# Patient Record
Sex: Female | Born: 2000 | Race: White | Hispanic: No | Marital: Single | Smoking: Never smoker
Health system: Southern US, Community
[De-identification: ages and names within clinical notes are randomized; demographics above are authoritative.]

## PROBLEM LIST (undated history)

## (undated) DIAGNOSIS — D649 Anemia, unspecified: Secondary | ICD-10-CM

## (undated) DIAGNOSIS — F32A Depression, unspecified: Secondary | ICD-10-CM

## (undated) DIAGNOSIS — F419 Anxiety disorder, unspecified: Secondary | ICD-10-CM

## (undated) DIAGNOSIS — F329 Major depressive disorder, single episode, unspecified: Secondary | ICD-10-CM

---
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
## 2020-05-29 DIAGNOSIS — Z419 Encounter for procedure for purposes other than remedying health state, unspecified: Secondary | ICD-10-CM | POA: Diagnosis not present

## 2020-06-29 DIAGNOSIS — Z419 Encounter for procedure for purposes other than remedying health state, unspecified: Secondary | ICD-10-CM | POA: Diagnosis not present

## 2020-07-30 DIAGNOSIS — Z419 Encounter for procedure for purposes other than remedying health state, unspecified: Secondary | ICD-10-CM | POA: Diagnosis not present

## 2020-08-29 DIAGNOSIS — Z419 Encounter for procedure for purposes other than remedying health state, unspecified: Secondary | ICD-10-CM | POA: Diagnosis not present

## 2020-09-29 DIAGNOSIS — Z419 Encounter for procedure for purposes other than remedying health state, unspecified: Secondary | ICD-10-CM | POA: Diagnosis not present

## 2020-10-29 DIAGNOSIS — Z419 Encounter for procedure for purposes other than remedying health state, unspecified: Secondary | ICD-10-CM | POA: Diagnosis not present

## 2020-11-29 DIAGNOSIS — Z419 Encounter for procedure for purposes other than remedying health state, unspecified: Secondary | ICD-10-CM | POA: Diagnosis not present

## 2020-12-18 ENCOUNTER — Other Ambulatory Visit: Payer: Self-pay

## 2020-12-18 ENCOUNTER — Ambulatory Visit (INDEPENDENT_AMBULATORY_CARE_PROVIDER_SITE_OTHER): Payer: Medicaid Other

## 2020-12-18 ENCOUNTER — Ambulatory Visit (INDEPENDENT_AMBULATORY_CARE_PROVIDER_SITE_OTHER): Payer: Medicaid Other | Admitting: Orthopaedic Surgery

## 2020-12-18 ENCOUNTER — Encounter: Payer: Self-pay | Admitting: Orthopaedic Surgery

## 2020-12-18 DIAGNOSIS — M79671 Pain in right foot: Secondary | ICD-10-CM | POA: Diagnosis not present
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
## 2020-12-30 DIAGNOSIS — Z419 Encounter for procedure for purposes other than remedying health state, unspecified: Secondary | ICD-10-CM | POA: Diagnosis not present

## 2021-01-13 DIAGNOSIS — Z3049 Encounter for surveillance of other contraceptives: Secondary | ICD-10-CM | POA: Diagnosis not present

## 2021-01-13 DIAGNOSIS — Z3046 Encounter for surveillance of implantable subdermal contraceptive: Secondary | ICD-10-CM | POA: Diagnosis not present

## 2021-01-13 DIAGNOSIS — Z113 Encounter for screening for infections with a predominantly sexual mode of transmission: Secondary | ICD-10-CM | POA: Diagnosis not present

## 2021-01-13 DIAGNOSIS — Z30011 Encounter for initial prescription of contraceptive pills: Secondary | ICD-10-CM | POA: Diagnosis not present

## 2021-01-27 DIAGNOSIS — Z419 Encounter for procedure for purposes other than remedying health state, unspecified: Secondary | ICD-10-CM | POA: Diagnosis not present

## 2021-02-27 DIAGNOSIS — Z419 Encounter for procedure for purposes other than remedying health state, unspecified: Secondary | ICD-10-CM | POA: Diagnosis not present

## 2021-03-29 DIAGNOSIS — Z419 Encounter for procedure for purposes other than remedying health state, unspecified: Secondary | ICD-10-CM | POA: Diagnosis not present

## 2021-04-23 DIAGNOSIS — Z6822 Body mass index (BMI) 22.0-22.9, adult: Secondary | ICD-10-CM | POA: Diagnosis not present

## 2021-04-23 DIAGNOSIS — Z0000 Encounter for general adult medical examination without abnormal findings: Secondary | ICD-10-CM | POA: Diagnosis not present

## 2021-04-23 DIAGNOSIS — Z713 Dietary counseling and surveillance: Secondary | ICD-10-CM | POA: Diagnosis not present

## 2021-04-23 DIAGNOSIS — Z113 Encounter for screening for infections with a predominantly sexual mode of transmission: Secondary | ICD-10-CM | POA: Diagnosis not present

## 2021-04-29 DIAGNOSIS — Z419 Encounter for procedure for purposes other than remedying health state, unspecified: Secondary | ICD-10-CM | POA: Diagnosis not present

## 2021-05-13 DIAGNOSIS — U071 COVID-19: Secondary | ICD-10-CM | POA: Diagnosis not present

## 2021-05-14 DIAGNOSIS — U071 COVID-19: Secondary | ICD-10-CM | POA: Diagnosis not present

## 2021-05-14 DIAGNOSIS — J069 Acute upper respiratory infection, unspecified: Secondary | ICD-10-CM | POA: Diagnosis not present

## 2021-05-29 DIAGNOSIS — Z419 Encounter for procedure for purposes other than remedying health state, unspecified: Secondary | ICD-10-CM | POA: Diagnosis not present

## 2021-06-29 DIAGNOSIS — Z419 Encounter for procedure for purposes other than remedying health state, unspecified: Secondary | ICD-10-CM | POA: Diagnosis not present

## 2021-07-14 DIAGNOSIS — R051 Acute cough: Secondary | ICD-10-CM | POA: Diagnosis not present

## 2021-07-14 DIAGNOSIS — J029 Acute pharyngitis, unspecified: Secondary | ICD-10-CM | POA: Diagnosis not present

## 2021-07-14 DIAGNOSIS — R6884 Jaw pain: Secondary | ICD-10-CM | POA: Diagnosis not present

## 2021-07-30 DIAGNOSIS — Z419 Encounter for procedure for purposes other than remedying health state, unspecified: Secondary | ICD-10-CM | POA: Diagnosis not present

## 2021-08-06 DIAGNOSIS — J069 Acute upper respiratory infection, unspecified: Secondary | ICD-10-CM | POA: Diagnosis not present

## 2021-08-06 DIAGNOSIS — R0981 Nasal congestion: Secondary | ICD-10-CM | POA: Diagnosis not present

## 2021-08-06 DIAGNOSIS — R051 Acute cough: Secondary | ICD-10-CM | POA: Diagnosis not present

## 2021-08-06 DIAGNOSIS — R519 Headache, unspecified: Secondary | ICD-10-CM | POA: Diagnosis not present

## 2021-08-29 DIAGNOSIS — Z419 Encounter for procedure for purposes other than remedying health state, unspecified: Secondary | ICD-10-CM | POA: Diagnosis not present

## 2021-09-29 DIAGNOSIS — Z419 Encounter for procedure for purposes other than remedying health state, unspecified: Secondary | ICD-10-CM | POA: Diagnosis not present

## 2021-10-29 DIAGNOSIS — Z419 Encounter for procedure for purposes other than remedying health state, unspecified: Secondary | ICD-10-CM | POA: Diagnosis not present

## 2021-11-11 ENCOUNTER — Ambulatory Visit: Payer: Medicaid Other | Admitting: Nurse Practitioner

## 2021-11-29 DIAGNOSIS — Z419 Encounter for procedure for purposes other than remedying health state, unspecified: Secondary | ICD-10-CM | POA: Diagnosis not present

## 2021-12-30 DIAGNOSIS — Z419 Encounter for procedure for purposes other than remedying health state, unspecified: Secondary | ICD-10-CM | POA: Diagnosis not present

## 2022-01-27 DIAGNOSIS — Z419 Encounter for procedure for purposes other than remedying health state, unspecified: Secondary | ICD-10-CM | POA: Diagnosis not present

## 2022-02-27 DIAGNOSIS — Z419 Encounter for procedure for purposes other than remedying health state, unspecified: Secondary | ICD-10-CM | POA: Diagnosis not present

## 2022-03-29 DIAGNOSIS — Z419 Encounter for procedure for purposes other than remedying health state, unspecified: Secondary | ICD-10-CM | POA: Diagnosis not present

## 2022-04-29 DIAGNOSIS — Z419 Encounter for procedure for purposes other than remedying health state, unspecified: Secondary | ICD-10-CM | POA: Diagnosis not present

## 2022-05-29 DIAGNOSIS — Z419 Encounter for procedure for purposes other than remedying health state, unspecified: Secondary | ICD-10-CM | POA: Diagnosis not present

## 2022-06-29 DIAGNOSIS — Z419 Encounter for procedure for purposes other than remedying health state, unspecified: Secondary | ICD-10-CM | POA: Diagnosis not present

## 2022-07-30 DIAGNOSIS — Z419 Encounter for procedure for purposes other than remedying health state, unspecified: Secondary | ICD-10-CM | POA: Diagnosis not present

## 2022-08-29 DIAGNOSIS — Z419 Encounter for procedure for purposes other than remedying health state, unspecified: Secondary | ICD-10-CM | POA: Diagnosis not present

## 2022-09-29 DIAGNOSIS — Z419 Encounter for procedure for purposes other than remedying health state, unspecified: Secondary | ICD-10-CM | POA: Diagnosis not present

## 2022-10-29 DIAGNOSIS — Z419 Encounter for procedure for purposes other than remedying health state, unspecified: Secondary | ICD-10-CM | POA: Diagnosis not present

## 2022-11-20 NOTE — Progress Notes (Deleted)
New patient visit   Patient: Carolyn Williams   DOB: 08/23/2001   21 y.o. Female  MRN: [id number] Visit Date: 12/01/2022  Today's healthcare provider: Debera Lat, PA-C   No chief complaint on file.  Subjective    Carolyn Williams is a 21 y.o. female who presents today as a new patient to establish care.  HPI  ***  Past Medical History:  Diagnosis Date   Anemia    Anxiety    Depression    No past surgical history on file. Family Status  Relation Name Status   Mother  Alive   Father  Alive   No family history on file. Social History   Socioeconomic History   Marital status: Single    Spouse name: Not on file   Number of children: Not on file   Years of education: Not on file   Highest education level: Not on file  Occupational History   Not on file  Tobacco Use   Smoking status: Never   Smokeless tobacco: Never  Vaping Use   Vaping Use: Never used  Substance and Sexual Activity   Alcohol use: Yes   Drug use: Not Currently   Sexual activity: Yes  Other Topics Concern   Not on file  Social History Narrative   Not on file   Social Determinants of Health   Financial Resource Strain: Not on file  Food Insecurity: Not on file  Transportation Needs: Not on file  Physical Activity: Not on file  Stress: Not on file  Social Connections: Not on file   Outpatient Medications Prior to Visit  Medication Sig   cyclobenzaprine (FLEXERIL) 10 MG tablet Take 1 tablet (10 mg total) by mouth 3 (three) times daily as needed.   ibuprofen (ADVIL) 600 MG tablet Take 1 tablet (600 mg total) by mouth every 8 (eight) hours as needed.   traMADol (ULTRAM) 50 MG tablet Take 1 tablet (50 mg total) by mouth every 6 (six) hours as needed for moderate pain.   No facility-administered medications prior to visit.   No Known Allergies   There is no immunization history on file for this patient.  Health Maintenance  Topic Date Due   COVID-19 Vaccine (1) Never done   HPV VACCINES  (1 - 2-dose series) Never done   CHLAMYDIA SCREENING  Never done   HIV Screening  Never done   Hepatitis C Screening  Never done   DTaP/Tdap/Td (1 - Tdap) Never done   PAP-Cervical Cytology Screening  Never done   PAP SMEAR-Modifier  Never done   INFLUENZA VACCINE  Never done    Patient Care Team: Chrys Racer, MD as PCP - General (Pediatrics)  Review of Systems  All other systems reviewed and are negative. Except see hpi  {Labs  Heme  Chem  Endocrine  Serology  Results Review (optional):23779}   Objective    There were no vitals taken for this visit. {Show previous vital signs (optional):23777}  Physical Exam Vitals reviewed.  Constitutional:      General: She is not in acute distress.    Appearance: Normal appearance. She is well-developed. She is not diaphoretic.  HENT:     Head: Normocephalic and atraumatic.     Right Ear: Tympanic membrane, ear canal and external ear normal.     Left Ear: Tympanic membrane, ear canal and external ear normal.     Nose: Nose normal.     Mouth/Throat:     Mouth: Mucous membranes  are moist.     Pharynx: Oropharynx is clear. No oropharyngeal exudate.  Eyes:     General: No scleral icterus.    Conjunctiva/sclera: Conjunctivae normal.     Pupils: Pupils are equal, round, and reactive to light.  Neck:     Thyroid: No thyromegaly.  Cardiovascular:     Rate and Rhythm: Normal rate and regular rhythm.     Pulses: Normal pulses.     Heart sounds: Normal heart sounds. No murmur heard. Pulmonary:     Effort: Pulmonary effort is normal. No respiratory distress.     Breath sounds: Normal breath sounds. No wheezing or rales.  Abdominal:     General: There is no distension.     Palpations: Abdomen is soft.     Tenderness: There is no abdominal tenderness.  Musculoskeletal:        General: No deformity.     Cervical back: Neck supple.     Right lower leg: No edema.     Left lower leg: No edema.  Lymphadenopathy:     Cervical:  No cervical adenopathy.  Skin:    General: Skin is warm and dry.     Findings: No rash.  Neurological:     Mental Status: She is alert and oriented to person, place, and time. Mental status is at baseline.     Sensory: No sensory deficit.     Motor: No weakness.     Gait: Gait normal.  Psychiatric:        Mood and Affect: Mood normal.        Behavior: Behavior normal.        Thought Content: Thought content normal.    ***  Depression Screen     No data to display         No results found for any visits on 12/01/22.  Assessment & Plan     ***

## 2022-11-23 ENCOUNTER — Ambulatory Visit: Payer: Medicaid Other | Admitting: Physician Assistant

## 2022-11-29 DIAGNOSIS — Z419 Encounter for procedure for purposes other than remedying health state, unspecified: Secondary | ICD-10-CM | POA: Diagnosis not present

## 2022-12-01 ENCOUNTER — Ambulatory Visit: Payer: Medicaid Other | Admitting: Physician Assistant

## 2022-12-01 DIAGNOSIS — F419 Anxiety disorder, unspecified: Secondary | ICD-10-CM

## 2022-12-01 DIAGNOSIS — F321 Major depressive disorder, single episode, moderate: Secondary | ICD-10-CM

## 2022-12-30 DIAGNOSIS — Z419 Encounter for procedure for purposes other than remedying health state, unspecified: Secondary | ICD-10-CM | POA: Diagnosis not present

## 2023-01-28 DIAGNOSIS — Z419 Encounter for procedure for purposes other than remedying health state, unspecified: Secondary | ICD-10-CM | POA: Diagnosis not present

## 2023-02-28 DIAGNOSIS — Z419 Encounter for procedure for purposes other than remedying health state, unspecified: Secondary | ICD-10-CM | POA: Diagnosis not present

## 2023-03-15 ENCOUNTER — Ambulatory Visit: Payer: Medicaid Other | Admitting: Physician Assistant

## 2023-03-30 DIAGNOSIS — Z419 Encounter for procedure for purposes other than remedying health state, unspecified: Secondary | ICD-10-CM | POA: Diagnosis not present

## 2023-04-30 DIAGNOSIS — Z419 Encounter for procedure for purposes other than remedying health state, unspecified: Secondary | ICD-10-CM | POA: Diagnosis not present

## 2023-05-30 DIAGNOSIS — Z419 Encounter for procedure for purposes other than remedying health state, unspecified: Secondary | ICD-10-CM | POA: Diagnosis not present

## 2023-06-03 ENCOUNTER — Encounter (HOSPITAL_COMMUNITY): Payer: Self-pay

## 2023-06-03 ENCOUNTER — Other Ambulatory Visit: Payer: Self-pay

## 2023-06-03 ENCOUNTER — Emergency Department (HOSPITAL_COMMUNITY)
Admission: EM | Admit: 2023-06-03 | Discharge: 2023-06-03 | Disposition: A | Discharge status: Home / Self Care | Payer: Medicaid Other | Attending: Emergency Medicine | Admitting: Emergency Medicine

## 2023-06-03 DIAGNOSIS — R040 Epistaxis: Secondary | ICD-10-CM | POA: Diagnosis not present

## 2023-06-03 DIAGNOSIS — R059 Cough, unspecified: Secondary | ICD-10-CM | POA: Diagnosis present

## 2023-06-03 DIAGNOSIS — U071 COVID-19: Secondary | ICD-10-CM

## 2023-06-03 LAB — BASIC METABOLIC PANEL
Anion gap: 11 (ref 5–15)
BUN: 10 mg/dL (ref 6–20)
CO2: 24 mmol/L (ref 22–32)
Calcium: 9 mg/dL (ref 8.9–10.3)
Chloride: 103 mmol/L (ref 98–111)
Creatinine, Ser: 0.7 mg/dL (ref 0.44–1.00)
GFR, Estimated: >60 mL/min (ref >60)
Glucose, Bld: 87 mg/dL (ref 70–99)
Potassium: 3.7 mmol/L (ref 3.5–5.1)
Sodium: 138 mmol/L (ref 135–145)

## 2023-06-03 LAB — CBC WITH DIFFERENTIAL/PLATELET
Abs Immature Granulocytes: 0.02 10*3/uL (ref 0.00–0.07)
Basophils Absolute: 0 10*3/uL (ref 0.0–0.1)
Basophils Relative: 0 %
Eosinophils Absolute: 0.1 10*3/uL (ref 0.0–0.5)
Eosinophils Relative: 2 %
HCT: 37.6 % (ref 36.0–46.0)
Hemoglobin: 12 g/dL (ref 12.0–15.0)
Immature Granulocytes: 0 %
Lymphocytes Relative: 25 %
Lymphs Abs: 1.4 10*3/uL (ref 0.7–4.0)
MCH: 28.6 pg (ref 26.0–34.0)
MCHC: 31.9 g/dL (ref 30.0–36.0)
MCV: 89.7 fL (ref 80.0–100.0)
Monocytes Absolute: 0.5 10*3/uL (ref 0.1–1.0)
Monocytes Relative: 10 %
Neutro Abs: 3.5 10*3/uL (ref 1.7–7.7)
Neutrophils Relative %: 63 %
Platelets: 143 10*3/uL — ABNORMAL LOW (ref 150–400)
RBC: 4.19 MIL/uL (ref 3.87–5.11)
RDW: 12.9 % (ref 11.5–15.5)
WBC: 5.5 10*3/uL (ref 4.0–10.5)
nRBC: 0 % (ref 0.0–0.2)

## 2023-06-03 LAB — RESP PANEL BY RT-PCR (RSV, FLU A&B, COVID)  RVPGX2
Influenza A by PCR: NEGATIVE
Influenza B by PCR: NEGATIVE
Resp Syncytial Virus by PCR: NEGATIVE
SARS Coronavirus 2 by RT PCR: POSITIVE — AB

## 2023-06-03 LAB — GROUP A STREP BY PCR: Group A Strep by PCR: NOT DETECTED

## 2023-06-03 NOTE — ED Triage Notes (Signed)
Pt states having a scratchy throat, runny nose, cough, and generally ill for 1 week. Also mentions tonight she has had up to 5 nosebleeds with some lasting up to 20 minutes at a time. At this current time she doesn't have a nose bleed. Pt has been taking tylenol and sudafed for the headache and head congestion. Has been taking the sudafed for 2 to 3 days, with some nasal spray along with that.

## 2023-06-03 NOTE — ED Provider Notes (Signed)
[place] EMERGENCY DEPARTMENT AT [facility] Provider Note   CSN: [id number] Arrival date & time: 06/03/23  0140     History  Chief Complaint  Patient presents with   Cough    Carolyn Williams is a 22 y.o. female who presents emergency department with concerns for cough onset 5 days.  No sick contacts at home with similar symptoms.  Has associated rhinorrhea, nasal congestion, sore throat, headache, nosebleeds.  Notes that she has had up to 5 nosebleeds today that have lasted from 20 minutes to an hour.  Patient notes that she has been stuffing tissues up her nose as well as washcloths of her nose as well for her nosebleeds.  She has been applying compression as well.  Has taken Sudafed x 2 doses over the course of her illness.  Denies nausea, vomiting, fever.  The history is provided by the patient. No language interpreter was used.       Home Medications Prior to Admission medications   Medication Sig Start Date End Date Taking? Authorizing Provider  cyclobenzaprine (FLEXERIL) 10 MG tablet Take 1 tablet (10 mg total) by mouth 3 (three) times daily as needed. 10/22/19   Joni Reining, PA-C  ibuprofen (ADVIL) 600 MG tablet Take 1 tablet (600 mg total) by mouth every 8 (eight) hours as needed. 10/22/19   Joni Reining, PA-C  traMADol (ULTRAM) 50 MG tablet Take 1 tablet (50 mg total) by mouth every 6 (six) hours as needed for moderate pain. 10/22/19   Joni Reining, PA-C      Allergies    Patient has no known allergies.    Review of Systems   Review of Systems  Respiratory:  Positive for cough.   All other systems reviewed and are negative.   Physical Exam Updated Vital Signs BP (!) 141/88   Pulse 100   Temp 98.6 F (37 C) (Oral)   Resp 16   Ht 5\' 6"  (1.676 m)   Wt 63.5 kg   SpO2 99%   BMI 22.60 kg/m  Physical Exam Vitals and nursing note reviewed.  Constitutional:      General: She is not in acute distress.    Appearance: Normal appearance.  HENT:      Nose: Rhinorrhea present. No nasal deformity, septal deviation, laceration, nasal tenderness or congestion. Rhinorrhea is clear.     Right Nostril: No foreign body, epistaxis, septal hematoma or occlusion.     Left Nostril: No foreign body, epistaxis, septal hematoma or occlusion.     Comments: Nose normal.  No deformity noted.  No septal deviation noted.  Clear rhinorrhea noted to bilateral naris.  No appreciable epistaxis noted.  No septal hematoma noted.  No foreign bodies noted to the bilateral nares.    Mouth/Throat:     Mouth: Mucous membranes are moist.     Pharynx: Oropharynx is clear. Uvula midline. No posterior oropharyngeal erythema or uvula swelling.     Tonsils: No tonsillar exudate or tonsillar abscesses.     Comments: Uvula midline without swelling. No posterior pharyngeal erythema or tonsillar exudate noted. Patent airway. Pt able to speak in clear complete sentences. Tolerating oral secretions. Eyes:     General: No scleral icterus.    Extraocular Movements: Extraocular movements intact.  Cardiovascular:     Rate and Rhythm: Normal rate and regular rhythm.     Pulses: Normal pulses.     Heart sounds: Normal heart sounds.  Pulmonary:     Effort: Pulmonary  effort is normal. No respiratory distress.     Breath sounds: Normal breath sounds.     Comments: Able to speak in clear complete sentences. Abdominal:     Palpations: Abdomen is soft. There is no mass.     Tenderness: There is no abdominal tenderness.  Musculoskeletal:        General: Normal range of motion.     Cervical back: Neck supple.  Skin:    General: Skin is warm and dry.     Findings: No rash.  Neurological:     Mental Status: She is alert.     Sensory: Sensation is intact.     Motor: Motor function is intact.  Psychiatric:        Behavior: Behavior normal.     ED Results / Procedures / Treatments   Labs (all labs ordered are listed, but only abnormal results are displayed) Labs Reviewed  RESP  PANEL BY RT-PCR (RSV, FLU A&B, COVID)  RVPGX2 - Abnormal; Notable for the following components:      Result Value   SARS Coronavirus 2 by RT PCR POSITIVE (*)    All other components within normal limits  CBC WITH DIFFERENTIAL/PLATELET - Abnormal; Notable for the following components:   Platelets 143 (*)    All other components within normal limits  GROUP A STREP BY PCR  BASIC METABOLIC PANEL    EKG None  Radiology No results found.  Procedures Procedures    Medications Ordered in ED Medications - No data to display  ED Course/ Medical Decision Making/ A&P Clinical Course as of 06/03/23 0409  Fri Jun 03, 2023  0348 SARS Coronavirus 2 by RT PCR(!): POSITIVE [SB]  0350 Re-evaluated and noted improvement of symptoms with treatment regimen. Discussed discharge treatment plan. Pt agreeable at this time. Pt appears safe for discharge. [SB]    Clinical Course User Index [SB] Milinda Sweeney A, PA-C                             Medical Decision Making Amount and/or Complexity of Data Reviewed Labs: ordered. Decision-making details documented in ED Course.   Pt presents with concerns for cough x 5 days.  Has sick contacts at home with similar symptoms.  Has tried 2 doses of Sudafed for symptoms.  Patient afebrile.  On exam patient withNose normal. No deformity noted. No septal deviation noted. Clear rhinorrhea noted to bilateral naris. No appreciable epistaxis noted. No septal hematoma noted. No foreign bodies noted to the bilateral nares.  Able to speak in clear complete sentences. Uvula midline without swelling. No posterior pharyngeal erythema or tonsillar exudate noted. Patent airway. Pt able to speak in clear complete sentences. Tolerating oral secretions.  No acute cardiovascular, respiratory, abdominal exam findings. Differential diagnosis includes COVID, flu, PNA, viral URI with cough, RSV, strep pharyngitis, viral pharyngitis.   Labs:  I ordered, and personally interpreted  labs.  The pertinent results include:   COVID swab positive Flu, strep, RSV swab negative    Disposition: Pt presentation suspicious for COVID-19. Doubt flu, RSV, strep pharyngitis, PNA, or viral URI with cough at this time. After consideration of the diagnostic results and the patients response to treatment, I feel that the patient would benefit from Discharge home. Pt will be discharged home with patient outside of the window for consideration of treatment for her COVID.Marland [employer] Work note provided. Supportive care measures and strict return precautions discussed with patient at bedside.  Pt acknowledges and verbalizes understanding. Pt appears safe for discharge. Follow up as indicated in discharge paperwork.    This chart was dictated using voice recognition software, Dragon. Despite the best efforts of this provider to proofread and correct errors, errors may still occur which can change documentation meaning.    Final Clinical Impression(s) / ED Diagnoses Final diagnoses:  COVID-19    Rx / DC Orders ED Discharge Orders     None         Nahuel Wilbert A, PA-C 06/03/23 0409    Shon Baton, MD 06/03/23 [phone]

## 2023-06-03 NOTE — Discharge Instructions (Addendum)
It was a pleasure taking care of you!   Your COVID swab was positive.  Your flu, RSV, strep swab was negative today.  Your labs did not show any concerning emergent findings today.  You may continue with over-the-counter cough and cold medications as needed for your symptoms.  For your nose, Ensure to apply pressure to the soft portion of your nose for 20 minutes.  Attached is further information with pictures on how to apply pressure to your nose to stop bleeding if it occurs again.  You may follow-up with your primary care provider as needed.  Return to the emergency department if you experience increasing/worsening symptoms.

## 2023-06-30 DIAGNOSIS — Z419 Encounter for procedure for purposes other than remedying health state, unspecified: Secondary | ICD-10-CM | POA: Diagnosis not present

## 2023-07-31 DIAGNOSIS — Z419 Encounter for procedure for purposes other than remedying health state, unspecified: Secondary | ICD-10-CM | POA: Diagnosis not present

## 2023-08-30 DIAGNOSIS — Z419 Encounter for procedure for purposes other than remedying health state, unspecified: Secondary | ICD-10-CM | POA: Diagnosis not present

## 2023-09-30 DIAGNOSIS — Z419 Encounter for procedure for purposes other than remedying health state, unspecified: Secondary | ICD-10-CM | POA: Diagnosis not present

## 2023-10-30 DIAGNOSIS — Z419 Encounter for procedure for purposes other than remedying health state, unspecified: Secondary | ICD-10-CM | POA: Diagnosis not present

## 2023-11-30 DIAGNOSIS — Z419 Encounter for procedure for purposes other than remedying health state, unspecified: Secondary | ICD-10-CM | POA: Diagnosis not present

## 2023-12-29 ENCOUNTER — Ambulatory Visit (HOSPITAL_COMMUNITY)
Admission: RE | Admit: 2023-12-29 | Discharge: 2023-12-29 | Disposition: A | Discharge status: Home / Self Care | Payer: Medicaid Other | Source: Ambulatory Visit | Attending: Emergency Medicine | Admitting: Emergency Medicine

## 2023-12-29 ENCOUNTER — Encounter (HOSPITAL_COMMUNITY): Payer: Self-pay

## 2023-12-29 VITALS — BP 110/77 | HR 97 | Temp 98.3°F | Resp 16 | Ht 66.0 in | Wt 140.0 lb

## 2023-12-29 DIAGNOSIS — J101 Influenza due to other identified influenza virus with other respiratory manifestations: Secondary | ICD-10-CM | POA: Diagnosis not present

## 2023-12-29 LAB — POC COVID19/FLU A&B COMBO
Covid Antigen, POC: NEGATIVE
Influenza A Antigen, POC: POSITIVE — AB
Influenza B Antigen, POC: NEGATIVE

## 2023-12-29 MED ORDER — OSELTAMIVIR PHOSPHATE 75 MG PO CAPS: 75.0000 mg | ORAL_CAPSULE | Freq: Two times a day (BID) | ORAL | 0 refills | Status: AC

## 2023-12-29 NOTE — ED Triage Notes (Signed)
"  Flu like symptoms such as fever, coughing, chills, and nose bleed." - Entered by patient.  Runny nose as well onset 2-3 days ago. No known sick exposure.   Patient tried Ny/Dayquil with mild relief.
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
## 2023-12-29 NOTE — Discharge Instructions (Addendum)
If you choose to take the Tamiflu, it may shorten the duration of your symptoms by 1 day.  Please note it will not treat your actual symptoms.  You can try ibuprofen, Tylenol, NyQuil, Mucinex, Benadryl, zyrtec, vicks vapor rub, etc. Make sure you are drinking lots of fluids!

## 2023-12-31 DIAGNOSIS — Z419 Encounter for procedure for purposes other than remedying health state, unspecified: Secondary | ICD-10-CM | POA: Diagnosis not present

## 2024-01-28 DIAGNOSIS — Z419 Encounter for procedure for purposes other than remedying health state, unspecified: Secondary | ICD-10-CM | POA: Diagnosis not present

## 2024-01-30 ENCOUNTER — Encounter: Payer: Self-pay | Admitting: General Practice

## 2024-01-30 ENCOUNTER — Ambulatory Visit: Payer: Medicaid Other | Admitting: General Practice

## 2024-01-30 VITALS — BP 110/80 | HR 74 | Temp 98.9°F | Ht 66.0 in | Wt 151.0 lb

## 2024-01-30 DIAGNOSIS — Z0000 Encounter for general adult medical examination without abnormal findings: Secondary | ICD-10-CM

## 2024-01-30 DIAGNOSIS — Z83438 Family history of other disorder of lipoprotein metabolism and other lipidemia: Secondary | ICD-10-CM

## 2024-01-30 DIAGNOSIS — Z7689 Persons encountering health services in other specified circumstances: Secondary | ICD-10-CM | POA: Insufficient documentation

## 2024-01-30 DIAGNOSIS — F321 Major depressive disorder, single episode, moderate: Secondary | ICD-10-CM

## 2024-01-30 DIAGNOSIS — Z113 Encounter for screening for infections with a predominantly sexual mode of transmission: Secondary | ICD-10-CM

## 2024-01-30 DIAGNOSIS — Z833 Family history of diabetes mellitus: Secondary | ICD-10-CM

## 2024-01-30 DIAGNOSIS — Z124 Encounter for screening for malignant neoplasm of cervix: Secondary | ICD-10-CM

## 2024-01-30 NOTE — Assessment & Plan Note (Signed)
 Family history of hyperlipidemia in father.
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
## 2024-01-30 NOTE — Assessment & Plan Note (Signed)
 Controlled.  No concerns today.

## 2024-01-30 NOTE — Patient Instructions (Addendum)
 Schedule lab appointment.   Schedule tetanus and flu shot at the health department.   You will either be contacted via phone regarding your referral to gynecology, or you may receive a letter on your MyChart portal from our referral team with instructions for scheduling an appointment. Please let us know if you have not been contacted by anyone within two weeks.   It was a pleasure to see you today!

## 2024-01-30 NOTE — Assessment & Plan Note (Signed)
 EMR reviewed briefly.

## 2024-01-30 NOTE — Assessment & Plan Note (Signed)
 Immunizations due - tetanus and influenza. Discussed and she will schedule at the health department. Pap smear referral placed. Mammogram due, orders placed. Colonoscopy UTD, due   Discussed the importance of a healthy diet and regular exercise in order for weight loss, and to reduce the risk of further co-morbidity.  Exam stable. Labs order placed. She would like to schedule lab appt to have them drawn another day.  Follow up in 1 year for repeat physical.

## 2024-01-30 NOTE — Assessment & Plan Note (Signed)
 Hemoglobin A1c pending

## 2024-02-03 ENCOUNTER — Encounter: Payer: Self-pay | Admitting: *Deleted

## 2024-02-06 ENCOUNTER — Other Ambulatory Visit

## 2024-02-28 ENCOUNTER — Ambulatory Visit (HOSPITAL_COMMUNITY): Payer: Self-pay

## 2024-03-01 ENCOUNTER — Ambulatory Visit (HOSPITAL_COMMUNITY)

## 2024-03-10 DIAGNOSIS — Z419 Encounter for procedure for purposes other than remedying health state, unspecified: Secondary | ICD-10-CM | POA: Diagnosis not present

## 2024-04-09 DIAGNOSIS — Z419 Encounter for procedure for purposes other than remedying health state, unspecified: Secondary | ICD-10-CM | POA: Diagnosis not present

## 2024-04-16 DIAGNOSIS — M25561 Pain in right knee: Secondary | ICD-10-CM | POA: Diagnosis not present

## 2024-04-16 DIAGNOSIS — S8702XA Crushing injury of left knee, initial encounter: Secondary | ICD-10-CM | POA: Diagnosis not present

## 2024-04-16 DIAGNOSIS — Z5329 Procedure and treatment not carried out because of patient's decision for other reasons: Secondary | ICD-10-CM | POA: Diagnosis not present

## 2024-04-16 DIAGNOSIS — Z743 Need for continuous supervision: Secondary | ICD-10-CM | POA: Diagnosis not present

## 2024-04-16 DIAGNOSIS — S8701XA Crushing injury of right knee, initial encounter: Secondary | ICD-10-CM | POA: Diagnosis not present

## 2024-05-10 DIAGNOSIS — Z419 Encounter for procedure for purposes other than remedying health state, unspecified: Secondary | ICD-10-CM | POA: Diagnosis not present

## 2024-05-25 ENCOUNTER — Ambulatory Visit: Admitting: Internal Medicine

## 2024-05-25 ENCOUNTER — Ambulatory Visit: Payer: Self-pay

## 2024-05-25 ENCOUNTER — Ambulatory Visit: Admitting: General Practice

## 2024-05-25 NOTE — Telephone Encounter (Signed)
 FYI Only or Action Required?: FYI only for provider.  Patient was last seen in primary care on 01/30/2024 by Vincente Shivers, NP. Called Nurse Triage reporting No chief complaint on file.. Symptoms began about a month ago. Interventions attempted: Other: beer and liquor. Symptoms are: gradually worsening.  Triage Disposition: See in office today (PCP has no openings) Patient/caregiver understands and will follow disposition?: yes Had to call [facility] - unable to pull up providers at [facility]- called [facility] and Amiya helped to get pt appt this am .  Copied from CRM #[id number]. Topic: Clinical - Red Word Triage >> May 25, 2024  8:47 AM Carolyn Williams wrote: Red Word that prompted transfer to Nurse Triage: Mental health issues : depression/anxiety

## 2024-05-30 ENCOUNTER — Ambulatory Visit: Payer: Self-pay | Admitting: General Practice

## 2024-05-30 ENCOUNTER — Ambulatory Visit (INDEPENDENT_AMBULATORY_CARE_PROVIDER_SITE_OTHER): Admitting: General Practice

## 2024-05-30 ENCOUNTER — Encounter: Payer: Self-pay | Admitting: General Practice

## 2024-05-30 VITALS — BP 110/78 | HR 71 | Temp 98.5°F | Ht 66.0 in | Wt 155.0 lb

## 2024-05-30 DIAGNOSIS — F321 Major depressive disorder, single episode, moderate: Secondary | ICD-10-CM | POA: Diagnosis not present

## 2024-05-30 DIAGNOSIS — Z83438 Family history of other disorder of lipoprotein metabolism and other lipidemia: Secondary | ICD-10-CM

## 2024-05-30 DIAGNOSIS — F32A Depression, unspecified: Secondary | ICD-10-CM | POA: Diagnosis not present

## 2024-05-30 DIAGNOSIS — Z113 Encounter for screening for infections with a predominantly sexual mode of transmission: Secondary | ICD-10-CM | POA: Diagnosis not present

## 2024-05-30 DIAGNOSIS — Z13220 Encounter for screening for lipoid disorders: Secondary | ICD-10-CM

## 2024-05-30 DIAGNOSIS — Z0000 Encounter for general adult medical examination without abnormal findings: Secondary | ICD-10-CM

## 2024-05-30 DIAGNOSIS — Z833 Family history of diabetes mellitus: Secondary | ICD-10-CM | POA: Diagnosis not present

## 2024-05-30 DIAGNOSIS — F419 Anxiety disorder, unspecified: Secondary | ICD-10-CM

## 2024-05-30 DIAGNOSIS — Z131 Encounter for screening for diabetes mellitus: Secondary | ICD-10-CM

## 2024-05-30 LAB — CBC
HCT: 39.3 % (ref 36.0–46.0)
Hemoglobin: 12.8 g/dL (ref 12.0–15.0)
MCHC: 32.6 g/dL (ref 30.0–36.0)
MCV: 85 fl (ref 78.0–100.0)
Platelets: 196 10*3/uL (ref 150.0–400.0)
RBC: 4.62 Mil/uL (ref 3.87–5.11)
RDW: 14.8 % (ref 11.5–15.5)
WBC: 5.9 10*3/uL (ref 4.0–10.5)

## 2024-05-30 LAB — LIPID PANEL
Cholesterol: 184 mg/dL (ref 0–200)
HDL: 65.9 mg/dL (ref >39.00)
LDL Cholesterol: 101 mg/dL — ABNORMAL HIGH (ref 0–99)
NonHDL: 117.83
Total CHOL/HDL Ratio: 3
Triglycerides: 84 mg/dL (ref 0.0–149.0)
VLDL: 16.8 mg/dL (ref 0.0–40.0)

## 2024-05-30 LAB — COMPREHENSIVE METABOLIC PANEL WITH GFR
ALT: 13 U/L (ref 0–35)
AST: 17 U/L (ref 0–37)
Albumin: 4.6 g/dL (ref 3.5–5.2)
Alkaline Phosphatase: 79 U/L (ref 39–117)
BUN: 9 mg/dL (ref 6–23)
CO2: 30 meq/L (ref 19–32)
Calcium: 9.3 mg/dL (ref 8.4–10.5)
Chloride: 104 meq/L (ref 96–112)
Creatinine, Ser: 0.75 mg/dL (ref 0.40–1.20)
GFR: 112.16 mL/min (ref >60.00)
Glucose, Bld: 79 mg/dL (ref 70–99)
Potassium: 4.3 meq/L (ref 3.5–5.1)
Sodium: 141 meq/L (ref 135–145)
Total Bilirubin: 0.6 mg/dL (ref 0.2–1.2)
Total Protein: 7.1 g/dL (ref 6.0–8.3)

## 2024-05-30 LAB — HEMOGLOBIN A1C: Hgb A1c MFr Bld: 5 % (ref 4.6–6.5)

## 2024-05-30 LAB — TSH: TSH: 1.55 u[IU]/mL (ref 0.35–5.50)

## 2024-05-30 MED ORDER — SERTRALINE HCL 50 MG PO TABS: 50.0000 mg | ORAL_TABLET | Freq: Every day | ORAL | 0 refills | Status: DC

## 2024-05-30 NOTE — Assessment & Plan Note (Signed)
 Uncontrolled.      05/30/2024   12:38 PM 01/30/2024    3:22 PM  GAD 7 : Generalized Anxiety Score  Nervous, Anxious, on Edge 3 2  Control/stop worrying 3 1  Worry too much - different things 3 2  Trouble relaxing 3 1  Restless 3 1  Easily annoyed or irritable 3 3  Afraid - awful might happen 2 2  Total GAD 7 Score 20 12  Anxiety Difficulty Very difficult Somewhat difficult      05/30/2024   12:37 PM 01/30/2024    3:20 PM  PHQ9 SCORE ONLY  PHQ-9 Total Score 21 8   Discussed treatment options at length.  Agreeable to restart medication.   Start zoloft  25 mg once daily for 7 days and then increase to 50 mg thereafter.  F/u in 4-5 weeks.  Denies si/hi.  Referral placed for therapy.

## 2024-05-30 NOTE — Patient Instructions (Signed)
 You will either be contacted via phone regarding your referral to psychologist , or you may receive a letter on your MyChart portal from our referral team with instructions for scheduling an appointment. Please let us  know if you have not been contacted by anyone within two weeks.  Start Sertraline  25 mg once daily for seven days and then increase to 50 mg once daily thereafter. It can make your symptoms worse for the first two weeks.  It was a pleasure to see you today! Follow up in 5 weeks.   It was a pleasure to see you today!
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
## 2024-05-30 NOTE — Assessment & Plan Note (Signed)
 Uncontrolled.     05/30/2024   12:37 PM 01/30/2024    3:20 PM  PHQ9 SCORE ONLY  PHQ-9 Total Score 21 8   Discussed treatment options at length.  Agreeable to restart medication.   Start zoloft  25 mg once daily for 7 days and then increase to 50 mg thereafter.  F/u in 4-5 weeks.  Denies si/hi.  Referral placed for therapy.

## 2024-05-31 LAB — HEPATITIS C ANTIBODY: Hepatitis C Ab: NONREACTIVE

## 2024-05-31 LAB — RPR: RPR Ser Ql: NONREACTIVE

## 2024-05-31 LAB — HIV ANTIBODY (ROUTINE TESTING W REFLEX): HIV 1&2 Ab, 4th Generation: NONREACTIVE

## 2024-06-02 LAB — CHLAMYDIA/GONOCOCCUS/TRICHOMONAS, NAA
Chlamydia by NAA: NEGATIVE
Gonococcus by NAA: NEGATIVE
Trich vag by NAA: NEGATIVE

## 2024-06-09 DIAGNOSIS — Z419 Encounter for procedure for purposes other than remedying health state, unspecified: Secondary | ICD-10-CM | POA: Diagnosis not present

## 2024-06-21 ENCOUNTER — Other Ambulatory Visit: Payer: Self-pay | Admitting: General Practice

## 2024-06-21 DIAGNOSIS — F321 Major depressive disorder, single episode, moderate: Secondary | ICD-10-CM

## 2024-07-03 ENCOUNTER — Ambulatory Visit: Admitting: Licensed Clinical Social Worker

## 2024-07-10 DIAGNOSIS — Z419 Encounter for procedure for purposes other than remedying health state, unspecified: Secondary | ICD-10-CM | POA: Diagnosis not present

## 2024-07-12 ENCOUNTER — Ambulatory Visit: Admitting: General Practice

## 2024-07-12 DIAGNOSIS — F321 Major depressive disorder, single episode, moderate: Secondary | ICD-10-CM

## 2024-07-17 ENCOUNTER — Encounter (HOSPITAL_COMMUNITY): Payer: Self-pay

## 2024-07-17 ENCOUNTER — Ambulatory Visit (HOSPITAL_COMMUNITY)
Admission: RE | Admit: 2024-07-17 | Discharge: 2024-07-17 | Disposition: A | Discharge status: Home / Self Care | Source: Ambulatory Visit | Attending: Family Medicine | Admitting: Family Medicine

## 2024-07-17 VITALS — BP 98/71 | HR 80 | Temp 98.1°F | Resp 20

## 2024-07-17 DIAGNOSIS — J069 Acute upper respiratory infection, unspecified: Secondary | ICD-10-CM | POA: Diagnosis not present

## 2024-07-17 DIAGNOSIS — J029 Acute pharyngitis, unspecified: Secondary | ICD-10-CM | POA: Diagnosis not present

## 2024-07-17 MED ORDER — PROMETHAZINE-DM 6.25-15 MG/5ML PO SYRP: 5.0000 mL | ORAL_SOLUTION | Freq: Four times a day (QID) | ORAL | 0 refills | Status: DC | PRN
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
## 2024-07-17 NOTE — ED Triage Notes (Signed)
 Patient reports sore throat, hoarse voice and cough x 4 days. Patient has been taking OTC medication with no relief.

## 2024-07-21 DIAGNOSIS — S0285XB Fracture of orbit, unspecified, initial encounter for open fracture: Secondary | ICD-10-CM | POA: Diagnosis not present

## 2024-07-21 DIAGNOSIS — S022XXA Fracture of nasal bones, initial encounter for closed fracture: Secondary | ICD-10-CM | POA: Diagnosis not present

## 2024-07-21 DIAGNOSIS — S0232XA Fracture of orbital floor, left side, initial encounter for closed fracture: Secondary | ICD-10-CM | POA: Diagnosis not present

## 2024-07-21 DIAGNOSIS — T797XXA Traumatic subcutaneous emphysema, initial encounter: Secondary | ICD-10-CM | POA: Diagnosis not present

## 2024-07-21 DIAGNOSIS — S022XXB Fracture of nasal bones, initial encounter for open fracture: Secondary | ICD-10-CM | POA: Diagnosis not present

## 2024-07-21 DIAGNOSIS — S0181XA Laceration without foreign body of other part of head, initial encounter: Secondary | ICD-10-CM | POA: Diagnosis not present

## 2024-07-21 DIAGNOSIS — R609 Edema, unspecified: Secondary | ICD-10-CM | POA: Diagnosis not present

## 2024-07-22 ENCOUNTER — Other Ambulatory Visit: Payer: Self-pay | Admitting: General Practice

## 2024-07-22 DIAGNOSIS — F321 Major depressive disorder, single episode, moderate: Secondary | ICD-10-CM

## 2024-08-06 DIAGNOSIS — S0292XA Unspecified fracture of facial bones, initial encounter for closed fracture: Secondary | ICD-10-CM | POA: Diagnosis not present

## 2024-08-10 DIAGNOSIS — Z419 Encounter for procedure for purposes other than remedying health state, unspecified: Secondary | ICD-10-CM | POA: Diagnosis not present

## 2024-08-13 DIAGNOSIS — S022XXD Fracture of nasal bones, subsequent encounter for fracture with routine healing: Secondary | ICD-10-CM | POA: Diagnosis not present

## 2024-08-13 DIAGNOSIS — G501 Atypical facial pain: Secondary | ICD-10-CM | POA: Diagnosis not present

## 2024-08-13 DIAGNOSIS — S0292XA Unspecified fracture of facial bones, initial encounter for closed fracture: Secondary | ICD-10-CM | POA: Diagnosis not present

## 2024-08-28 ENCOUNTER — Ambulatory Visit (HOSPITAL_COMMUNITY)
Admission: RE | Admit: 2024-08-28 | Discharge: 2024-08-28 | Disposition: A | Discharge status: Home / Self Care | Source: Ambulatory Visit | Attending: Emergency Medicine | Admitting: Emergency Medicine

## 2024-08-28 ENCOUNTER — Encounter (HOSPITAL_COMMUNITY): Payer: Self-pay

## 2024-08-28 VITALS — BP 86/48 | HR 102 | Temp 98.9°F | Resp 16

## 2024-08-28 DIAGNOSIS — U071 COVID-19: Secondary | ICD-10-CM

## 2024-08-28 LAB — POC COVID19/FLU A&B COMBO
Covid Antigen, POC: POSITIVE — AB
Influenza A Antigen, POC: NEGATIVE
Influenza B Antigen, POC: NEGATIVE

## 2024-08-28 LAB — POCT RAPID STREP A (OFFICE): Rapid Strep A Screen: NEGATIVE
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
## 2024-08-28 NOTE — Discharge Instructions (Addendum)
 You tested positive for COVID-19 today, this is a viral illness that typically last 5 to 7 days in duration.  For fever, body aches and chills please alternate between 600 mg of ibuprofen  and 500 mg of Tylenol  every 4-6 hours.  Ensure you are staying well-hydrated with at least 64 ounces of water daily.  Please go home and eat some food.  Take position changes slowly, your blood pressure was lower than usual today and you are at risk for passing out.  Return to clinic or seek follow-up care if no improvement over the next 5 to 7 days or if you develop new concerning symptoms.

## 2024-08-28 NOTE — ED Triage Notes (Signed)
 Pt c/o sore throat, fever, nasal congestion, and body aches since yesterday. States too Robitussin with little relief.

## 2024-09-17 ENCOUNTER — Other Ambulatory Visit: Payer: Self-pay

## 2024-09-17 ENCOUNTER — Emergency Department (HOSPITAL_COMMUNITY)
Admission: EM | Admit: 2024-09-17 | Discharge: 2024-09-18 | Disposition: A | Discharge status: Home / Self Care | Attending: Emergency Medicine | Admitting: Emergency Medicine

## 2024-09-17 ENCOUNTER — Encounter (HOSPITAL_COMMUNITY): Payer: Self-pay

## 2024-09-17 DIAGNOSIS — R9431 Abnormal electrocardiogram [ECG] [EKG]: Secondary | ICD-10-CM | POA: Diagnosis not present

## 2024-09-17 DIAGNOSIS — G4489 Other headache syndrome: Secondary | ICD-10-CM | POA: Diagnosis not present

## 2024-09-17 DIAGNOSIS — S9001XA Contusion of right ankle, initial encounter: Secondary | ICD-10-CM | POA: Insufficient documentation

## 2024-09-17 DIAGNOSIS — T1490XA Injury, unspecified, initial encounter: Secondary | ICD-10-CM | POA: Diagnosis not present

## 2024-09-17 DIAGNOSIS — Z743 Need for continuous supervision: Secondary | ICD-10-CM | POA: Diagnosis not present

## 2024-09-17 DIAGNOSIS — S7011XA Contusion of right thigh, initial encounter: Secondary | ICD-10-CM | POA: Insufficient documentation

## 2024-09-17 DIAGNOSIS — R0781 Pleurodynia: Secondary | ICD-10-CM | POA: Diagnosis not present

## 2024-09-17 DIAGNOSIS — S79921A Unspecified injury of right thigh, initial encounter: Secondary | ICD-10-CM | POA: Diagnosis present

## 2024-09-17 DIAGNOSIS — S0502XA Injury of conjunctiva and corneal abrasion without foreign body, left eye, initial encounter: Secondary | ICD-10-CM | POA: Insufficient documentation

## 2024-09-17 DIAGNOSIS — T07XXXA Unspecified multiple injuries, initial encounter: Secondary | ICD-10-CM
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
## 2024-09-18 ENCOUNTER — Emergency Department (HOSPITAL_COMMUNITY)

## 2024-09-18 DIAGNOSIS — S3993XA Unspecified injury of pelvis, initial encounter: Secondary | ICD-10-CM | POA: Diagnosis not present

## 2024-09-18 DIAGNOSIS — S8011XA Contusion of right lower leg, initial encounter: Secondary | ICD-10-CM | POA: Diagnosis not present

## 2024-09-18 DIAGNOSIS — S299XXA Unspecified injury of thorax, initial encounter: Secondary | ICD-10-CM | POA: Diagnosis not present

## 2024-09-18 DIAGNOSIS — S9001XA Contusion of right ankle, initial encounter: Secondary | ICD-10-CM | POA: Diagnosis not present

## 2024-09-18 DIAGNOSIS — R0602 Shortness of breath: Secondary | ICD-10-CM | POA: Diagnosis not present

## 2024-09-18 DIAGNOSIS — S199XXA Unspecified injury of neck, initial encounter: Secondary | ICD-10-CM | POA: Diagnosis not present

## 2024-09-18 DIAGNOSIS — S0993XA Unspecified injury of face, initial encounter: Secondary | ICD-10-CM | POA: Diagnosis not present

## 2024-09-18 DIAGNOSIS — S3991XA Unspecified injury of abdomen, initial encounter: Secondary | ICD-10-CM | POA: Diagnosis not present

## 2024-09-18 DIAGNOSIS — R079 Chest pain, unspecified: Secondary | ICD-10-CM | POA: Diagnosis not present

## 2024-09-18 DIAGNOSIS — S0990XA Unspecified injury of head, initial encounter: Secondary | ICD-10-CM | POA: Diagnosis not present

## 2024-09-18 LAB — BASIC METABOLIC PANEL WITH GFR
Anion gap: 12 (ref 5–15)
BUN: 6 mg/dL (ref 6–20)
CO2: 23 mmol/L (ref 22–32)
Calcium: 9 mg/dL (ref 8.9–10.3)
Chloride: 108 mmol/L (ref 98–111)
Creatinine, Ser: 0.73 mg/dL (ref 0.44–1.00)
GFR, Estimated: >60 mL/min (ref >60)
Glucose, Bld: 104 mg/dL — ABNORMAL HIGH (ref 70–99)
Potassium: 4 mmol/L (ref 3.5–5.1)
Sodium: 143 mmol/L (ref 135–145)

## 2024-09-18 LAB — CBC WITH DIFFERENTIAL/PLATELET
Abs Immature Granulocytes: 0.02 K/uL (ref 0.00–0.07)
Basophils Absolute: 0 K/uL (ref 0.0–0.1)
Basophils Relative: 1 %
Eosinophils Absolute: 0 K/uL (ref 0.0–0.5)
Eosinophils Relative: 1 %
HCT: 39.6 % (ref 36.0–46.0)
Hemoglobin: 11.5 g/dL — ABNORMAL LOW (ref 12.0–15.0)
Immature Granulocytes: 0 %
Lymphocytes Relative: 28 %
Lymphs Abs: 1.4 K/uL (ref 0.7–4.0)
MCH: 24.5 pg — ABNORMAL LOW (ref 26.0–34.0)
MCHC: 29 g/dL — ABNORMAL LOW (ref 30.0–36.0)
MCV: 84.4 fL (ref 80.0–100.0)
Monocytes Absolute: 0.3 K/uL (ref 0.1–1.0)
Monocytes Relative: 6 %
Neutro Abs: 3.3 K/uL (ref 1.7–7.7)
Neutrophils Relative %: 64 %
Platelets: 252 K/uL (ref 150–400)
RBC: 4.69 MIL/uL (ref 3.87–5.11)
RDW: 14.7 % (ref 11.5–15.5)
WBC: 5.1 K/uL (ref 4.0–10.5)
nRBC: 0 % (ref 0.0–0.2)

## 2024-09-18 LAB — TROPONIN T, HIGH SENSITIVITY
Troponin T High Sensitivity: <15 ng/L (ref 0–19)
Troponin T High Sensitivity: <15 ng/L (ref 0–19)

## 2024-09-18 LAB — HCG, SERUM, QUALITATIVE: Preg, Serum: NEGATIVE

## 2024-09-18 MED ORDER — IOHEXOL 300 MG/ML  SOLN
100.0000 mL | Freq: Once | INTRAMUSCULAR | Status: AC | PRN
  Administered 2024-09-18: 100 mL via INTRAVENOUS

## 2024-09-18 MED ORDER — POLYMYXIN B-TRIMETHOPRIM 10000-0.1 UNIT/ML-% OP SOLN: 1.0000 [drp] | Freq: Four times a day (QID) | OPHTHALMIC | 0 refills | Status: AC

## 2024-09-18 MED ORDER — TETRACAINE HCL 0.5 % OP SOLN
2.0000 [drp] | Freq: Once | OPHTHALMIC | Status: DC
  Filled 2024-09-18: qty 4

## 2024-09-18 MED ORDER — HYDROCODONE-ACETAMINOPHEN 5-325 MG PO TABS
1.0000 | ORAL_TABLET | Freq: Once | ORAL | Status: AC
  Administered 2024-09-18: 1 via ORAL
  Filled 2024-09-18: qty 1

## 2024-09-18 MED ORDER — FLUORESCEIN SODIUM 1 MG OP STRP
ORAL_STRIP | OPHTHALMIC | Status: AC
  Filled 2024-09-18: qty 1

## 2024-09-18 MED ORDER — FLUORESCEIN SODIUM 1 MG OP STRP
1.0000 | ORAL_STRIP | Freq: Once | OPHTHALMIC | Status: DC
  Filled 2024-09-18: qty 1

## 2024-09-18 NOTE — Discharge Instructions (Signed)
 Testing is reassuring.  No evidence of new traumatic injury.  Take the antibiotic drops as prescribed for your corneal abrasion and follow-up with your primary doctor and ophthalmologist.  Return to the ED with new or worsening symptoms.
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
## 2024-09-26 ENCOUNTER — Ambulatory Visit (HOSPITAL_COMMUNITY): Payer: Self-pay

## 2024-10-16 ENCOUNTER — Other Ambulatory Visit: Payer: Self-pay

## 2024-10-16 ENCOUNTER — Emergency Department

## 2024-10-16 ENCOUNTER — Inpatient Hospital Stay
Admission: EM | Admit: 2024-10-16 | Discharge: 2024-10-19 | DRG: 871 | Disposition: A | Discharge status: Home / Self Care | Attending: Internal Medicine | Admitting: Internal Medicine

## 2024-10-16 DIAGNOSIS — Z3202 Encounter for pregnancy test, result negative: Secondary | ICD-10-CM | POA: Diagnosis present

## 2024-10-16 DIAGNOSIS — N390 Urinary tract infection, site not specified: Secondary | ICD-10-CM | POA: Diagnosis not present

## 2024-10-16 DIAGNOSIS — Z83438 Family history of other disorder of lipoprotein metabolism and other lipidemia: Secondary | ICD-10-CM

## 2024-10-16 DIAGNOSIS — A419 Sepsis, unspecified organism: Secondary | ICD-10-CM | POA: Diagnosis present

## 2024-10-16 DIAGNOSIS — R079 Chest pain, unspecified: Secondary | ICD-10-CM | POA: Diagnosis not present

## 2024-10-16 DIAGNOSIS — F419 Anxiety disorder, unspecified: Secondary | ICD-10-CM | POA: Diagnosis present

## 2024-10-16 DIAGNOSIS — D6959 Other secondary thrombocytopenia: Secondary | ICD-10-CM | POA: Diagnosis present

## 2024-10-16 DIAGNOSIS — E876 Hypokalemia: Secondary | ICD-10-CM | POA: Diagnosis present

## 2024-10-16 DIAGNOSIS — Z1152 Encounter for screening for COVID-19: Secondary | ICD-10-CM | POA: Diagnosis not present

## 2024-10-16 DIAGNOSIS — R55 Syncope and collapse: Secondary | ICD-10-CM | POA: Diagnosis not present

## 2024-10-16 DIAGNOSIS — Z833 Family history of diabetes mellitus: Secondary | ICD-10-CM | POA: Diagnosis not present

## 2024-10-16 DIAGNOSIS — R509 Fever, unspecified: Secondary | ICD-10-CM | POA: Diagnosis not present

## 2024-10-16 DIAGNOSIS — R000 Tachycardia, unspecified: Secondary | ICD-10-CM | POA: Diagnosis not present

## 2024-10-16 DIAGNOSIS — A4150 Gram-negative sepsis, unspecified: Secondary | ICD-10-CM | POA: Diagnosis not present

## 2024-10-16 DIAGNOSIS — R571 Hypovolemic shock: Secondary | ICD-10-CM | POA: Diagnosis not present

## 2024-10-16 DIAGNOSIS — Z8249 Family history of ischemic heart disease and other diseases of the circulatory system: Secondary | ICD-10-CM | POA: Diagnosis not present

## 2024-10-16 DIAGNOSIS — R1111 Vomiting without nausea: Secondary | ICD-10-CM | POA: Diagnosis not present

## 2024-10-16 DIAGNOSIS — R6521 Severe sepsis with septic shock: Secondary | ICD-10-CM | POA: Diagnosis present

## 2024-10-16 DIAGNOSIS — R578 Other shock: Secondary | ICD-10-CM | POA: Diagnosis not present

## 2024-10-16 DIAGNOSIS — E860 Dehydration: Secondary | ICD-10-CM | POA: Diagnosis not present

## 2024-10-16 DIAGNOSIS — A4151 Sepsis due to Escherichia coli [E. coli]: Secondary | ICD-10-CM | POA: Diagnosis not present

## 2024-10-16 DIAGNOSIS — F32A Depression, unspecified: Secondary | ICD-10-CM | POA: Diagnosis present

## 2024-10-16 DIAGNOSIS — D649 Anemia, unspecified: Secondary | ICD-10-CM | POA: Diagnosis not present

## 2024-10-16 DIAGNOSIS — R110 Nausea: Secondary | ICD-10-CM | POA: Diagnosis not present

## 2024-10-16 DIAGNOSIS — Z743 Need for continuous supervision: Secondary | ICD-10-CM | POA: Diagnosis not present

## 2024-10-16 DIAGNOSIS — Z62810 Personal history of physical and sexual abuse in childhood: Secondary | ICD-10-CM | POA: Diagnosis not present

## 2024-10-16 DIAGNOSIS — R42 Dizziness and giddiness: Secondary | ICD-10-CM | POA: Diagnosis not present

## 2024-10-16 LAB — URINALYSIS, ROUTINE W REFLEX MICROSCOPIC
Bilirubin Urine: NEGATIVE
Glucose, UA: NEGATIVE mg/dL
Hgb urine dipstick: NEGATIVE
Ketones, ur: 20 mg/dL — AB
Nitrite: POSITIVE — AB
Protein, ur: 100 mg/dL — AB
Specific Gravity, Urine: 1.023 (ref 1.005–1.030)
WBC, UA: >50 WBC/hpf (ref 0–5)
pH: 5 (ref 5.0–8.0)

## 2024-10-16 LAB — BASIC METABOLIC PANEL WITH GFR
Anion gap: 11 (ref 5–15)
BUN: 8 mg/dL (ref 6–20)
CO2: 23 mmol/L (ref 22–32)
Calcium: 8.7 mg/dL — ABNORMAL LOW (ref 8.9–10.3)
Chloride: 101 mmol/L (ref 98–111)
Creatinine, Ser: 0.95 mg/dL (ref 0.44–1.00)
GFR, Estimated: >60 mL/min
Glucose, Bld: 100 mg/dL — ABNORMAL HIGH (ref 70–99)
Potassium: 4.1 mmol/L (ref 3.5–5.1)
Sodium: 135 mmol/L (ref 135–145)

## 2024-10-16 LAB — CBC
HCT: 36.9 % (ref 36.0–46.0)
Hemoglobin: 11.4 g/dL — ABNORMAL LOW (ref 12.0–15.0)
MCH: 25.4 pg — ABNORMAL LOW (ref 26.0–34.0)
MCHC: 30.9 g/dL (ref 30.0–36.0)
MCV: 82.2 fL (ref 80.0–100.0)
Platelets: 175 K/uL (ref 150–400)
RBC: 4.49 MIL/uL (ref 3.87–5.11)
RDW: 15 % (ref 11.5–15.5)
WBC: 13 K/uL — ABNORMAL HIGH (ref 4.0–10.5)
nRBC: 0 % (ref 0.0–0.2)

## 2024-10-16 LAB — RESP PANEL BY RT-PCR (RSV, FLU A&B, COVID)  RVPGX2
Influenza A by PCR: NEGATIVE
Influenza B by PCR: NEGATIVE
Resp Syncytial Virus by PCR: NEGATIVE
SARS Coronavirus 2 by RT PCR: NEGATIVE

## 2024-10-16 LAB — TROPONIN T, HIGH SENSITIVITY: Troponin T High Sensitivity: <15 ng/L (ref 0–19)

## 2024-10-16 LAB — POC URINE PREG, ED: Preg Test, Ur: NEGATIVE

## 2024-10-16 MED ORDER — SODIUM CHLORIDE 0.9 % IV BOLUS
1000.0000 mL | Freq: Once | INTRAVENOUS | Status: AC
  Administered 2024-10-16: 1000 mL via INTRAVENOUS

## 2024-10-16 MED ORDER — ENOXAPARIN SODIUM 40 MG/0.4ML IJ SOSY
40.0000 mg | PREFILLED_SYRINGE | INTRAMUSCULAR | Status: DC
  Administered 2024-10-17 – 2024-10-18 (×3): 40 mg via SUBCUTANEOUS
  Filled 2024-10-16 (×3): qty 0.4

## 2024-10-16 MED ORDER — ACETAMINOPHEN 650 MG RE SUPP
650.0000 mg | Freq: Four times a day (QID) | RECTAL | Status: DC | PRN
Start: 2024-10-16 — End: 2024-10-19

## 2024-10-16 MED ORDER — MAGNESIUM HYDROXIDE 400 MG/5ML PO SUSP
30.0000 mL | Freq: Every day | ORAL | Status: DC | PRN
Start: 2024-10-16 — End: 2024-10-19

## 2024-10-16 MED ORDER — SERTRALINE HCL 50 MG PO TABS: 50.0000 mg | ORAL_TABLET | Freq: Every day | ORAL | Status: DC

## 2024-10-16 MED ORDER — LACTATED RINGERS IV BOLUS
1000.0000 mL | Freq: Once | INTRAVENOUS | Status: AC
  Administered 2024-10-16: 1000 mL via INTRAVENOUS

## 2024-10-16 MED ORDER — ONDANSETRON HCL 4 MG/2ML IJ SOLN
4.0000 mg | Freq: Four times a day (QID) | INTRAMUSCULAR | Status: DC | PRN
  Administered 2024-10-17: 4 mg via INTRAVENOUS
  Filled 2024-10-16: qty 2

## 2024-10-16 MED ORDER — SODIUM CHLORIDE 0.9 % IV SOLN
INTRAVENOUS | Status: AC
  Filled 2024-10-16: qty 10

## 2024-10-16 MED ORDER — LACTATED RINGERS IV SOLN
150.0000 mL/h | INTRAVENOUS | Status: DC
  Administered 2024-10-17: 150 mL/h via INTRAVENOUS

## 2024-10-16 MED ORDER — ACETAMINOPHEN 325 MG PO TABS
650.0000 mg | ORAL_TABLET | Freq: Four times a day (QID) | ORAL | Status: DC | PRN
  Administered 2024-10-17 – 2024-10-18 (×6): 650 mg via ORAL
  Filled 2024-10-16 (×6): qty 2

## 2024-10-16 MED ORDER — TRAZODONE HCL 50 MG PO TABS: 25.0000 mg | ORAL_TABLET | Freq: Every evening | ORAL | Status: DC | PRN

## 2024-10-16 MED ORDER — ONDANSETRON HCL 4 MG PO TABS: 4.0000 mg | ORAL_TABLET | Freq: Four times a day (QID) | ORAL | Status: DC | PRN

## 2024-10-16 MED ORDER — SODIUM CHLORIDE 0.9 % IV SOLN: 2.0000 g | INTRAVENOUS | Status: DC

## 2024-10-16 MED ORDER — KETOROLAC TROMETHAMINE 30 MG/ML IJ SOLN
30.0000 mg | Freq: Once | INTRAMUSCULAR | Status: AC
  Administered 2024-10-16: 30 mg via INTRAVENOUS
  Filled 2024-10-16: qty 1

## 2024-10-16 MED ORDER — SODIUM CHLORIDE 0.9 % IV SOLN
1.0000 g | Freq: Once | INTRAVENOUS | Status: AC
  Administered 2024-10-16: 1 g via INTRAVENOUS

## 2024-10-16 MED ORDER — ONDANSETRON HCL 4 MG/2ML IJ SOLN
4.0000 mg | Freq: Once | INTRAMUSCULAR | Status: AC
  Administered 2024-10-16: 4 mg via INTRAVENOUS
  Filled 2024-10-16: qty 2

## 2024-10-16 MED ORDER — SODIUM CHLORIDE 0.9 % IV SOLN
1.0000 g | Freq: Once | INTRAVENOUS | Status: AC
  Administered 2024-10-17: 1 g via INTRAVENOUS
  Filled 2024-10-16: qty 10

## 2024-10-16 MED ORDER — ACETAMINOPHEN 325 MG PO TABS
650.0000 mg | ORAL_TABLET | Freq: Once | ORAL | Status: AC | PRN
  Administered 2024-10-16: 650 mg via ORAL
  Filled 2024-10-16: qty 2
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
## 2024-10-16 NOTE — ED Triage Notes (Signed)
 First nurse note: pt to ED [facility] from home for not feeling well, emesis after drinking large amount of water, near syncope in shower. Denies injury. +abd pain. 4mg  zofran , fluid PTA

## 2024-10-16 NOTE — Sepsis Progress Note (Signed)
 Elink monitoring for the code sepsis protocol.

## 2024-10-16 NOTE — Assessment & Plan Note (Signed)
Will continue Zoloft. 

## 2024-10-16 NOTE — Progress Notes (Signed)
 CODE SEPSIS - PHARMACY COMMUNICATION  **Broad Spectrum Antibiotics should be administered within 1 hour of Sepsis diagnosis**  Time Code Sepsis Called/Page Received:  11/18 @ 2248   Antibiotics Ordered:  Ceftriaxone   Time of 1st antibiotic administration: Ceftriaxone 1 gm IV X 1 on 11/18 @ 2219   Additional action taken by pharmacy:   If necessary, Name of Provider/Nurse Contacted:     Isley Weisheit D ,PharmD Clinical Pharmacist  10/16/2024  11:03 PM

## 2024-10-16 NOTE — ED Triage Notes (Signed)
 Pt arrives via [facility] from home with c/o a syncopal episode while in the shower earlier, pt stated that they felt their body getting really heavy and lowered themselves into the bottom of the shower and then went out. Pt also states that they woke up with symptoms of dehydration this morning that lasted all day and was able to drink 2 bottles of water bottles with liquid iv in them, but stated that they never feel like that all day. Pt endorses cold chills and 1 episode of emesis. Pt is A&Ox4 and ambulatory during triage.

## 2024-10-16 NOTE — Assessment & Plan Note (Signed)
-   The patient will be admitted to a medical telemetry bed. - Will continue antibiotic therapy with IV Rocephin . - Will continue hydration with IV lactated ringer . - Will follow blood and urine cultures.

## 2024-10-16 NOTE — ED Provider Notes (Signed)
 [facility] Provider Note    Event Date/Time   First MD Initiated Contact with Patient 10/16/24 2051     (approximate)  History   Chief Complaint: Dehydration and Loss of Consciousness  HPI  Carolyn Williams is a 23 y.o. female with a past medical history of anemia, anxiety, presents to the emergency department after a syncopal event.  According to the patient since this morning she has not been feeling well, states she felt like she could be dehydrated so she tried to drink water and ended up vomiting.  Patient states she was in the shower when she began feeling very lightheaded and had a brief syncopal versus near syncopal event.  Patient states since this afternoon she has been experiencing pain in the lower abdomen mostly in the left lower quadrant.  Upon arrival to the emergency department patient found to be febrile to 103.1 which was unknown to the patient.  Patient denies any upper respiratory symptoms no cough or congestion.  Patient denies any dysuria or hematuria.  No vaginal bleeding or discharge.  Physical Exam   Triage Vital Signs: ED Triage Vitals  Encounter Vitals Group     BP 10/16/24 1848 92/63     Girls Systolic BP Percentile --      Girls Diastolic BP Percentile --      Boys Systolic BP Percentile --      Boys Diastolic BP Percentile --      Pulse Rate 10/16/24 1848 (!) 117     Resp 10/16/24 1848 17     Temp 10/16/24 1848 (!) 103.1 F (39.5 C)     Temp Source 10/16/24 1848 Oral     SpO2 10/16/24 1848 100 %     Weight 10/16/24 1850 150 lb (68 kg)     Height 10/16/24 1850 5' 6 (1.676 m)     Head Circumference --      Peak Flow --      Pain Score 10/16/24 1848 6     Pain Loc --      Pain Education --      Exclude from Growth Chart --     Most recent vital signs: Vitals:   10/16/24 1848  BP: 92/63  Pulse: (!) 117  Resp: 17  Temp: (!) 103.1 F (39.5 C)  SpO2: 100%    General: Awake, no distress.  CV:  Good peripheral  perfusion.  Regular rate and rhythm around 100 bpm. Resp:  Normal effort.  Equal breath sounds bilaterally.  Abd:  No distention.  Soft, moderate left lower quadrant and suprapubic tenderness to palpation.  No rebound or guarding.  ED Results / Procedures / Treatments   RADIOLOGY  I have reviewed interpret the chest x-ray images.  No consolidation on my evaluation. Radiology has read the x-ray is negative   MEDICATIONS ORDERED IN ED: Medications  acetaminophen  (TYLENOL ) tablet 650 mg (650 mg Oral Given 10/16/24 1900)  sodium chloride  0.9 % bolus 1,000 mL (1,000 mLs Intravenous New Bag/Given 10/16/24 2122)  ketorolac (TORADOL) 30 MG/ML injection 30 mg (30 mg Intravenous Given 10/16/24 2130)  ondansetron  (ZOFRAN ) injection 4 mg (4 mg Intravenous Given 10/16/24 2125)     IMPRESSION / MDM / ASSESSMENT AND PLAN / ED COURSE  I reviewed the triage vital signs and the nursing notes.  Patient's presentation is most consistent with acute presentation with potential threat to life or bodily function.  Patient presents emergency department after a near syncopal episode at home she  has been nauseated with a couple episodes of vomiting today is experiencing lower abdominal pain found to be febrile to 103.1.  Patient is tachycardic has a white blood cell count of 13,000 on her CBC.  Chemistry is reassuring.  Patient's respiratory panel was negative.  Pregnancy test is negative.  Urinalysis is pending.  Patient denies any vaginal discharge or bleeding.  Given the patient's suprapubic tenderness if the urinalysis shows a urinary tract infection I believe this is likely the cause of her symptoms however if the urinalysis is negative patient will need further workup such as CT imaging.  Patient agreeable to plan of care.  Will IV hydrate we will treat Toradol and Zofran  and continue to closely monitor while awaiting further results.  Patient's urinalysis has resulted showing significant urinary tract  infection with many bacteria nitrite positive greater than 50 white cells and white blood cell clumps consistent with urinary tract infection.  Urine culture has been added on.  Patient still has a low-grade temperature 100.4, still feels overall poorly and remains nauseated.  Given the patient's leukocytosis urinary infection and significant fever and malaise we will admit for IV antibiotics we will send blood cultures.  Will continue with IV hydration.  Patient agreeable to plan of care.  CRITICAL CARE Performed by: Franky Moores   Total critical care time: 30 minutes  Critical care time was exclusive of separately billable procedures and treating other patients.  Critical care was necessary to treat or prevent imminent or life-threatening deterioration.  Critical care was time spent personally by me on the following activities: development of treatment plan with patient and/or surrogate as well as nursing, discussions with consultants, evaluation of patient's response to treatment, examination of patient, obtaining history from patient or surrogate, ordering and performing treatments and interventions, ordering and review of laboratory studies, ordering and review of radiographic studies, pulse oximetry and re-evaluation of patient's condition.   FINAL CLINICAL IMPRESSION(S) / ED DIAGNOSES   Fever Near syncope Urinary tract infection Sepsis  Note:  This document was prepared using Dragon voice recognition software and may include unintentional dictation errors.   Moores Franky, MD 10/16/24 2248

## 2024-10-17 ENCOUNTER — Inpatient Hospital Stay

## 2024-10-17 ENCOUNTER — Inpatient Hospital Stay: Admit: 2024-10-17

## 2024-10-17 DIAGNOSIS — R000 Tachycardia, unspecified: Secondary | ICD-10-CM | POA: Diagnosis not present

## 2024-10-17 DIAGNOSIS — F419 Anxiety disorder, unspecified: Secondary | ICD-10-CM | POA: Diagnosis not present

## 2024-10-17 DIAGNOSIS — F32A Depression, unspecified: Secondary | ICD-10-CM | POA: Diagnosis not present

## 2024-10-17 DIAGNOSIS — A4150 Gram-negative sepsis, unspecified: Secondary | ICD-10-CM | POA: Diagnosis not present

## 2024-10-17 DIAGNOSIS — E876 Hypokalemia: Secondary | ICD-10-CM | POA: Diagnosis not present

## 2024-10-17 DIAGNOSIS — R7989 Other specified abnormal findings of blood chemistry: Secondary | ICD-10-CM | POA: Diagnosis not present

## 2024-10-17 DIAGNOSIS — R579 Shock, unspecified: Secondary | ICD-10-CM | POA: Diagnosis not present

## 2024-10-17 DIAGNOSIS — R6520 Severe sepsis without septic shock: Secondary | ICD-10-CM | POA: Diagnosis not present

## 2024-10-17 DIAGNOSIS — N390 Urinary tract infection, site not specified: Secondary | ICD-10-CM | POA: Diagnosis not present

## 2024-10-17 DIAGNOSIS — D696 Thrombocytopenia, unspecified: Secondary | ICD-10-CM | POA: Diagnosis not present

## 2024-10-17 DIAGNOSIS — J90 Pleural effusion, not elsewhere classified: Secondary | ICD-10-CM | POA: Diagnosis not present

## 2024-10-17 DIAGNOSIS — D649 Anemia, unspecified: Secondary | ICD-10-CM | POA: Diagnosis not present

## 2024-10-17 DIAGNOSIS — E860 Dehydration: Secondary | ICD-10-CM | POA: Diagnosis not present

## 2024-10-17 DIAGNOSIS — A419 Sepsis, unspecified organism: Secondary | ICD-10-CM | POA: Diagnosis not present

## 2024-10-17 LAB — CBC
HCT: 28.2 % — ABNORMAL LOW (ref 36.0–46.0)
Hemoglobin: 8.9 g/dL — ABNORMAL LOW (ref 12.0–15.0)
MCH: 25.9 pg — ABNORMAL LOW (ref 26.0–34.0)
MCHC: 31.6 g/dL (ref 30.0–36.0)
MCV: 82 fL (ref 80.0–100.0)
Platelets: 141 K/uL — ABNORMAL LOW (ref 150–400)
RBC: 3.44 MIL/uL — ABNORMAL LOW (ref 3.87–5.11)
RDW: 15.1 % (ref 11.5–15.5)
WBC: 15.1 K/uL — ABNORMAL HIGH (ref 4.0–10.5)
nRBC: 0 % (ref 0.0–0.2)

## 2024-10-17 LAB — RESPIRATORY PANEL BY PCR

## 2024-10-17 LAB — CBC WITH DIFFERENTIAL/PLATELET
Abs Immature Granulocytes: 0.34 K/uL — ABNORMAL HIGH (ref 0.00–0.07)
Basophils Absolute: 0 K/uL (ref 0.0–0.1)
Basophils Relative: 0 %
Eosinophils Absolute: 0.1 K/uL (ref 0.0–0.5)
Eosinophils Relative: 0 %
HCT: 30.6 % — ABNORMAL LOW (ref 36.0–46.0)
Hemoglobin: 9.6 g/dL — ABNORMAL LOW (ref 12.0–15.0)
Immature Granulocytes: 2 %
Lymphocytes Relative: 7 %
Lymphs Abs: 1.2 K/uL (ref 0.7–4.0)
MCH: 25.8 pg — ABNORMAL LOW (ref 26.0–34.0)
MCHC: 31.4 g/dL (ref 30.0–36.0)
MCV: 82.3 fL (ref 80.0–100.0)
Monocytes Absolute: 0.7 K/uL (ref 0.1–1.0)
Monocytes Relative: 4 %
Neutro Abs: 15.6 K/uL — ABNORMAL HIGH (ref 1.7–7.7)
Neutrophils Relative %: 87 %
Platelets: 150 K/uL (ref 150–400)
RBC: 3.72 MIL/uL — ABNORMAL LOW (ref 3.87–5.11)
RDW: 15.3 % (ref 11.5–15.5)
WBC: 18 K/uL — ABNORMAL HIGH (ref 4.0–10.5)
nRBC: 0 % (ref 0.0–0.2)

## 2024-10-17 LAB — D-DIMER, QUANTITATIVE: D-Dimer, Quant: 1.22 ug{FEU}/mL — ABNORMAL HIGH (ref 0.00–0.50)

## 2024-10-17 LAB — PROCALCITONIN: Procalcitonin: 2.01 ng/mL

## 2024-10-17 LAB — CORTISOL-AM, BLOOD: Cortisol - AM: 14.1 ug/dL (ref 6.7–22.6)

## 2024-10-17 LAB — MRSA NEXT GEN BY PCR, NASAL: MRSA by PCR Next Gen: NOT DETECTED

## 2024-10-17 LAB — BASIC METABOLIC PANEL WITH GFR
Anion gap: 7 (ref 5–15)
Anion gap: 13 (ref 5–15)
BUN: 9 mg/dL (ref 6–20)
BUN: 9 mg/dL (ref 6–20)
CO2: 21 mmol/L — ABNORMAL LOW (ref 22–32)
CO2: 19 mmol/L — ABNORMAL LOW (ref 22–32)
Calcium: 7.2 mg/dL — ABNORMAL LOW (ref 8.9–10.3)
Calcium: 7.1 mg/dL — ABNORMAL LOW (ref 8.9–10.3)
Chloride: 108 mmol/L (ref 98–111)
Chloride: 108 mmol/L (ref 98–111)
Creatinine, Ser: 0.9 mg/dL (ref 0.44–1.00)
Creatinine, Ser: 0.88 mg/dL (ref 0.44–1.00)
GFR, Estimated: >60 mL/min (ref >60)
GFR, Estimated: >60 mL/min (ref >60)
Glucose, Bld: 107 mg/dL — ABNORMAL HIGH (ref 70–99)
Glucose, Bld: 91 mg/dL (ref 70–99)
Potassium: 3.2 mmol/L — ABNORMAL LOW (ref 3.5–5.1)
Potassium: 4.2 mmol/L (ref 3.5–5.1)
Sodium: 136 mmol/L (ref 135–145)
Sodium: 139 mmol/L (ref 135–145)

## 2024-10-17 LAB — LIPASE, BLOOD: Lipase: 16 U/L (ref 11–51)

## 2024-10-17 LAB — PROTIME-INR
INR: 1.5 — ABNORMAL HIGH (ref 0.8–1.2)
Prothrombin Time: 19.1 s — ABNORMAL HIGH (ref 11.4–15.2)

## 2024-10-17 LAB — LACTIC ACID, PLASMA
Lactic Acid, Venous: 1.3 mmol/L (ref 0.5–1.9)
Lactic Acid, Venous: 1.9 mmol/L (ref 0.5–1.9)
Lactic Acid, Venous: 2 mmol/L (ref 0.5–1.9)
Lactic Acid, Venous: 2 mmol/L (ref 0.5–1.9)
Lactic Acid, Venous: 2.8 mmol/L (ref 0.5–1.9)
Lactic Acid, Venous: 4.4 mmol/L (ref 0.5–1.9)

## 2024-10-17 LAB — URINE DRUG SCREEN
Amphetamines: NEGATIVE
Barbiturates: NEGATIVE
Benzodiazepines: NEGATIVE
Cocaine: NEGATIVE
Fentanyl: NEGATIVE
Methadone Scn, Ur: NEGATIVE
Opiates: NEGATIVE
Tetrahydrocannabinol: NEGATIVE

## 2024-10-17 LAB — TECHNOLOGIST SMEAR REVIEW: Plt Morphology: NORMAL

## 2024-10-17 LAB — PRO BRAIN NATRIURETIC PEPTIDE: Pro Brain Natriuretic Peptide: 1513 pg/mL — ABNORMAL HIGH (ref <300.0)

## 2024-10-17 LAB — TROPONIN T, HIGH SENSITIVITY
Troponin T High Sensitivity: <15 ng/L (ref 0–19)
Troponin T High Sensitivity: <15 ng/L (ref 0–19)

## 2024-10-17 LAB — HEPATIC FUNCTION PANEL
ALT: 14 U/L (ref 0–44)
AST: 22 U/L (ref 15–41)
Albumin: 3.3 g/dL — ABNORMAL LOW (ref 3.5–5.0)
Alkaline Phosphatase: 75 U/L (ref 38–126)
Bilirubin, Direct: 0.3 mg/dL — ABNORMAL HIGH (ref 0.0–0.2)
Indirect Bilirubin: 0.2 mg/dL — ABNORMAL LOW (ref 0.3–0.9)
Total Bilirubin: 0.6 mg/dL (ref 0.0–1.2)
Total Protein: 5.5 g/dL — ABNORMAL LOW (ref 6.5–8.1)

## 2024-10-17 LAB — GLUCOSE, CAPILLARY: Glucose-Capillary: 84 mg/dL (ref 70–99)

## 2024-10-17 MED ORDER — POTASSIUM CHLORIDE CRYS ER 20 MEQ PO TBCR
40.0000 meq | EXTENDED_RELEASE_TABLET | Freq: Once | ORAL | Status: AC
  Administered 2024-10-17: 40 meq via ORAL
  Filled 2024-10-17: qty 2

## 2024-10-17 MED ORDER — THIAMINE HCL 100 MG PO TABS
100.0000 mg | ORAL_TABLET | Freq: Every day | ORAL | Status: DC
Start: 2024-10-17 — End: 2024-10-19
  Administered 2024-10-17 – 2024-10-19 (×3): 100 mg via ORAL
  Filled 2024-10-17 (×5): qty 1

## 2024-10-17 MED ORDER — CHLORHEXIDINE GLUCONATE CLOTH 2 % EX PADS
6.0000 | MEDICATED_PAD | Freq: Every day | CUTANEOUS | Status: DC
  Administered 2024-10-17 – 2024-10-18 (×2): 6 via TOPICAL

## 2024-10-17 MED ORDER — SODIUM CHLORIDE 0.9 % IV SOLN
250.0000 mL | INTRAVENOUS | Status: DC
  Administered 2024-10-17: 250 mL via INTRAVENOUS

## 2024-10-17 MED ORDER — IOHEXOL 350 MG/ML SOLN
75.0000 mL | Freq: Once | INTRAVENOUS | Status: AC | PRN
  Administered 2024-10-17: 75 mL via INTRAVENOUS

## 2024-10-17 MED ORDER — NOREPINEPHRINE 4 MG/250ML-% IV SOLN
0.0000 ug/min | INTRAVENOUS | Status: DC
Start: 2024-10-17 — End: 2024-10-18
  Administered 2024-10-17: 2 ug/min via INTRAVENOUS
  Filled 2024-10-17: qty 250

## 2024-10-17 MED ORDER — OXYCODONE HCL 5 MG PO TABS
10.0000 mg | ORAL_TABLET | Freq: Four times a day (QID) | ORAL | Status: DC | PRN
  Administered 2024-10-17: 10 mg via ORAL
  Filled 2024-10-17: qty 2

## 2024-10-17 MED ORDER — LACTATED RINGERS IV BOLUS
500.0000 mL | Freq: Once | INTRAVENOUS | Status: AC
  Administered 2024-10-17: 500 mL via INTRAVENOUS

## 2024-10-17 MED ORDER — PIPERACILLIN-TAZOBACTAM 3.375 G IVPB
3.3750 g | Freq: Three times a day (TID) | INTRAVENOUS | Status: DC
  Administered 2024-10-17 – 2024-10-19 (×6): 3.375 g via INTRAVENOUS
  Filled 2024-10-17 (×6): qty 50

## 2024-10-17 MED ORDER — ENSURE PLUS HIGH PROTEIN PO LIQD
237.0000 mL | Freq: Two times a day (BID) | ORAL | Status: DC
  Administered 2024-10-17 – 2024-10-18 (×2): 237 mL via ORAL

## 2024-10-17 MED ORDER — MIDODRINE HCL 5 MG PO TABS
10.0000 mg | ORAL_TABLET | Freq: Three times a day (TID) | ORAL | Status: DC
  Administered 2024-10-17 – 2024-10-18 (×5): 10 mg via ORAL
  Filled 2024-10-17 (×5): qty 2

## 2024-10-17 MED ORDER — CALCIUM GLUCONATE-NACL 2-0.675 GM/100ML-% IV SOLN
2.0000 g | Freq: Once | INTRAVENOUS | Status: AC
  Administered 2024-10-17: 2000 mg via INTRAVENOUS
  Filled 2024-10-17: qty 100

## 2024-10-17 MED ORDER — SODIUM CHLORIDE 0.9 % IV BOLUS
1000.0000 mL | Freq: Once | INTRAVENOUS | Status: AC
  Administered 2024-10-17: 1000 mL via INTRAVENOUS

## 2024-10-17 MED ORDER — MORPHINE SULFATE (PF) 2 MG/ML IV SOLN: 2.0000 mg | INTRAVENOUS | Status: DC | PRN

## 2024-10-17 MED ORDER — SODIUM CHLORIDE 0.9 % IV BOLUS
500.0000 mL | Freq: Once | INTRAVENOUS | Status: AC
  Administered 2024-10-17: 500 mL via INTRAVENOUS

## 2024-10-17 MED ORDER — ADULT MULTIVITAMIN W/MINERALS CH
1.0000 | ORAL_TABLET | Freq: Every day | ORAL | Status: DC
  Administered 2024-10-18 – 2024-10-19 (×2): 1 via ORAL
  Filled 2024-10-17 (×2): qty 1

## 2024-10-17 MED ORDER — ORAL CARE MOUTH RINSE: 15.0000 mL | OROMUCOSAL | Status: DC | PRN

## 2024-10-17 MED ORDER — SODIUM CHLORIDE 0.9 % IV SOLN: INTRAVENOUS | Status: DC

## 2024-10-17 NOTE — ED Notes (Signed)
 Hypotensive overnight-- hospitalist aware and managing with fluids/midodrine . Pt resting comfortably/asymptomatic with improved HR; afebrile. No nausea. Mentating at baseline. Cont to monitor.

## 2024-10-17 NOTE — TOC CM/SW Note (Signed)
 Transition of Care [facility]) - Inpatient Brief Assessment   Patient Details  Name: Roshawna Colclasure MRN: [id number] Date of Birth: May 10, 2001  Transition of Care [facility]) CM/SW Contact:    Nathanael CHRISTELLA Ring, RN Phone Number: 10/17/2024, 3:08 PM   Clinical Narrative:  Transition of Care [facility]) Screening Note   Patient Details  Name: Yani Coventry Date of Birth: 16-Oct-2001   Transition of Care [facility]) CM/SW Contact:    Nathanael CHRISTELLA Ring, RN Phone Number: 10/17/2024, 3:08 PM    Transition of Care Department [facility]) has reviewed patient and no TOC needs have been identified at this time. If new patient transition needs arise, please place a TOC consult.     Transition of Care Asessment: Insurance and Status: Insurance coverage has been reviewed Patient has primary care physician: Yes     Prior/Current Home Services: No current home services Social Drivers of Health Review: SDOH reviewed no interventions necessary Readmission risk has been reviewed: Yes (low risk) Transition of care needs: no transition of care needs at this time

## 2024-10-17 NOTE — Plan of Care (Signed)
  Problem: Fluid Volume: Goal: Hemodynamic stability will improve Outcome: Progressing   Problem: Education: Goal: Knowledge of General Education information will improve Description: Including pain rating scale, medication(s)/side effects and non-pharmacologic comfort measures Outcome: Progressing   Problem: Clinical Measurements: Goal: Cardiovascular complication will be avoided Outcome: Progressing   Problem: Clinical Measurements: Goal: Diagnostic test results will improve Outcome: Not Progressing Goal: Signs and symptoms of infection will decrease Outcome: Not Progressing   Problem: Respiratory: Goal: Ability to maintain adequate ventilation will improve Outcome: Not Progressing   Problem: Health Behavior/Discharge Planning: Goal: Ability to manage health-related needs will improve Outcome: Not Progressing   Problem: Clinical Measurements: Goal: Ability to maintain clinical measurements within normal limits will improve Outcome: Not Progressing Goal: Will remain free from infection Outcome: Not Progressing Goal: Diagnostic test results will improve Outcome: Not Progressing Goal: Respiratory complications will improve Outcome: Not Progressing   Problem: Activity: Goal: Risk for activity intolerance will decrease Outcome: Not Progressing   Problem: Nutrition: Goal: Adequate nutrition will be maintained Outcome: Not Progressing   Problem: Coping: Goal: Level of anxiety will decrease Outcome: Not Progressing

## 2024-10-17 NOTE — Progress Notes (Signed)
 PHARMACY CONSULT NOTE - FOLLOW UP  Pharmacy Consult for Electrolyte Monitoring and Replacement   Recent Labs: Potassium (mmol/L)  Date Value  10/17/2024 3.2 (L)   Calcium (mg/dL)  Date Value  88/80/7974 7.2 (L)   Albumin (g/dL)  Date Value  92/97/7974 4.6   Sodium (mmol/L)  Date Value  10/17/2024 136     Assessment: Caucasian female with medical history significant for anxiety, depression, and anemia, who presented to the emergency room with acute onset of syncope and hypotension / UTI. Pharmacy is asked to follow and replace electrolytes  Goal of Therapy:  Electrolytes WNL  Plan:  ---40 mEq po KCl x 1 ---repeat BMP and recheck electrolytes in am  Carolyn Williams ,PharmD Clinical Pharmacist 10/17/2024 7:11 AM

## 2024-10-17 NOTE — Plan of Care (Signed)
  Problem: Fluid Volume: Goal: Hemodynamic stability will improve Outcome: Progressing   Problem: Education: Goal: Knowledge of General Education information will improve Description: Including pain rating scale, medication(s)/side effects and non-pharmacologic comfort measures Outcome: Progressing   Problem: Clinical Measurements: Goal: Cardiovascular complication will be avoided 10/17/2024 1558 by Cimone Fahey, Sonny HERO, RN Outcome: Progressing 10/17/2024 1556 by Poppy Mcafee, Sonny HERO, RN Outcome: Progressing   Problem: Safety: Goal: Ability to remain free from injury will improve Outcome: Progressing   Problem: Skin Integrity: Goal: Risk for impaired skin integrity will decrease Outcome: Progressing   Problem: Clinical Measurements: Goal: Diagnostic test results will improve Outcome: Not Progressing Goal: Signs and symptoms of infection will decrease Outcome: Not Progressing   Problem: Respiratory: Goal: Ability to maintain adequate ventilation will improve Outcome: Not Progressing   Problem: Health Behavior/Discharge Planning: Goal: Ability to manage health-related needs will improve Outcome: Not Progressing   Problem: Clinical Measurements: Goal: Ability to maintain clinical measurements within normal limits will improve 10/17/2024 1558 by Tramond Slinker, Sonny HERO, RN Outcome: Not Progressing 10/17/2024 1556 by Merian Sonny HERO, RN Outcome: Not Progressing Goal: Will remain free from infection 10/17/2024 1558 by Faraaz Wolin, Sonny HERO, RN Outcome: Not Progressing 10/17/2024 1556 by Merian Sonny HERO, RN Outcome: Not Progressing Goal: Diagnostic test results will improve 10/17/2024 1558 by Gwendolin Briel, Sonny HERO, RN Outcome: Not Progressing 10/17/2024 1556 by Merian Sonny HERO, RN Outcome: Not Progressing Goal: Respiratory complications will improve 10/17/2024 1558 by Larah Kuntzman, Sonny HERO, RN Outcome: Not Progressing 10/17/2024 1556 by Argil Mahl, Sonny HERO, RN Outcome: Not Progressing   Problem:  Activity: Goal: Risk for activity intolerance will decrease 10/17/2024 1558 by Holly Iannaccone, Sonny HERO, RN Outcome: Not Progressing 10/17/2024 1556 by Thamara Leger, Sonny HERO, RN Outcome: Not Progressing   Problem: Nutrition: Goal: Adequate nutrition will be maintained 10/17/2024 1558 by Dell Briner, Sonny HERO, RN Outcome: Not Progressing 10/17/2024 1556 by Merian Sonny HERO, RN Outcome: Not Progressing   Problem: Coping: Goal: Level of anxiety will decrease 10/17/2024 1558 by Maeson Lourenco, Sonny HERO, RN Outcome: Not Progressing 10/17/2024 1556 by Toshia Larkin, Sonny HERO, RN Outcome: Not Progressing   Problem: Pain Managment: Goal: General experience of comfort will improve and/or be controlled Outcome: Not Progressing   Problem: Urinary Elimination: Goal: Signs and symptoms of infection will decrease Outcome: Not Progressing
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
## 2024-10-18 ENCOUNTER — Inpatient Hospital Stay: Admit: 2024-10-18 | Discharge: 2024-10-18 | Disposition: A | Attending: Pulmonary Disease

## 2024-10-18 DIAGNOSIS — D72829 Elevated white blood cell count, unspecified: Secondary | ICD-10-CM | POA: Diagnosis not present

## 2024-10-18 DIAGNOSIS — D649 Anemia, unspecified: Secondary | ICD-10-CM | POA: Diagnosis not present

## 2024-10-18 DIAGNOSIS — F32A Depression, unspecified: Secondary | ICD-10-CM | POA: Diagnosis not present

## 2024-10-18 DIAGNOSIS — A419 Sepsis, unspecified organism: Secondary | ICD-10-CM

## 2024-10-18 DIAGNOSIS — A4150 Gram-negative sepsis, unspecified: Secondary | ICD-10-CM | POA: Diagnosis not present

## 2024-10-18 DIAGNOSIS — F419 Anxiety disorder, unspecified: Secondary | ICD-10-CM | POA: Diagnosis not present

## 2024-10-18 DIAGNOSIS — N390 Urinary tract infection, site not specified: Secondary | ICD-10-CM | POA: Diagnosis not present

## 2024-10-18 DIAGNOSIS — R6521 Severe sepsis with septic shock: Secondary | ICD-10-CM | POA: Diagnosis not present

## 2024-10-18 DIAGNOSIS — R578 Other shock: Secondary | ICD-10-CM | POA: Diagnosis not present

## 2024-10-18 LAB — CBC
HCT: 28 % — ABNORMAL LOW (ref 36.0–46.0)
Hemoglobin: 8.7 g/dL — ABNORMAL LOW (ref 12.0–15.0)
MCH: 25.4 pg — ABNORMAL LOW (ref 26.0–34.0)
MCHC: 31.1 g/dL (ref 30.0–36.0)
MCV: 81.9 fL (ref 80.0–100.0)
Platelets: 158 K/uL (ref 150–400)
RBC: 3.42 MIL/uL — ABNORMAL LOW (ref 3.87–5.11)
RDW: 15.4 % (ref 11.5–15.5)
WBC: 11.7 K/uL — ABNORMAL HIGH (ref 4.0–10.5)
nRBC: 0 % (ref 0.0–0.2)

## 2024-10-18 LAB — PROTIME-INR
INR: 1.5 — ABNORMAL HIGH (ref 0.8–1.2)
Prothrombin Time: 18.5 s — ABNORMAL HIGH (ref 11.4–15.2)

## 2024-10-18 LAB — ECHOCARDIOGRAM COMPLETE
AR max vel: 3.03 cm2
AV Area VTI: 3.12 cm2
AV Area mean vel: 3.08 cm2
AV Mean grad: 3 mmHg
AV Peak grad: 4.8 mmHg
Ao pk vel: 1.1 m/s
Area-P 1/2: 6.83 cm2
Height: 66 in
S' Lateral: 2.3 cm
Weight: 1537.93 [oz_av]

## 2024-10-18 LAB — RENAL FUNCTION PANEL
Albumin: 2.9 g/dL — ABNORMAL LOW (ref 3.5–5.0)
Anion gap: 11 (ref 5–15)
BUN: 9 mg/dL (ref 6–20)
CO2: 20 mmol/L — ABNORMAL LOW (ref 22–32)
Calcium: 7.6 mg/dL — ABNORMAL LOW (ref 8.9–10.3)
Chloride: 106 mmol/L (ref 98–111)
Creatinine, Ser: 0.8 mg/dL (ref 0.44–1.00)
GFR, Estimated: >60 mL/min (ref >60)
Glucose, Bld: 89 mg/dL (ref 70–99)
Phosphorus: 1.7 mg/dL — ABNORMAL LOW (ref 2.5–4.6)
Potassium: 3.5 mmol/L (ref 3.5–5.1)
Sodium: 137 mmol/L (ref 135–145)

## 2024-10-18 LAB — PROCALCITONIN: Procalcitonin: 9.51 ng/mL

## 2024-10-18 LAB — PRO BRAIN NATRIURETIC PEPTIDE: Pro Brain Natriuretic Peptide: 1537 pg/mL — ABNORMAL HIGH (ref <300.0)

## 2024-10-18 LAB — MAGNESIUM: Magnesium: 1.4 mg/dL — ABNORMAL LOW (ref 1.7–2.4)

## 2024-10-18 LAB — STREP PNEUMONIAE URINARY ANTIGEN: Strep Pneumo Urinary Antigen: NEGATIVE

## 2024-10-18 MED ORDER — POTASSIUM PHOSPHATES 15 MMOLE/5ML IV SOLN
30.0000 mmol | Freq: Once | INTRAVENOUS | Status: AC
  Administered 2024-10-18: 30 mmol via INTRAVENOUS
  Filled 2024-10-18: qty 10

## 2024-10-18 MED ORDER — DIAZEPAM 5 MG/ML IJ SOLN: 2.5000 mg | Freq: Once | INTRAMUSCULAR | Status: DC

## 2024-10-18 MED ORDER — METOPROLOL TARTRATE 25 MG PO TABS
12.5000 mg | ORAL_TABLET | Freq: Two times a day (BID) | ORAL | Status: DC
  Administered 2024-10-18 – 2024-10-19 (×3): 12.5 mg via ORAL
  Filled 2024-10-18 (×3): qty 1

## 2024-10-18 MED ORDER — PROCHLORPERAZINE EDISYLATE 10 MG/2ML IJ SOLN
10.0000 mg | Freq: Once | INTRAMUSCULAR | Status: AC
  Administered 2024-10-18: 10 mg via INTRAVENOUS
  Filled 2024-10-18: qty 2

## 2024-10-18 MED ORDER — MAGNESIUM SULFATE 4 GM/100ML IV SOLN
4.0000 g | Freq: Once | INTRAVENOUS | Status: AC
  Administered 2024-10-18: 4 g via INTRAVENOUS
  Filled 2024-10-18: qty 100

## 2024-10-18 NOTE — Progress Notes (Signed)
 PROGRESS NOTE    Carolyn Williams  FMW:[id number] DOB: Jan 22, 2001 DOA: 10/16/2024 PCP: Vincente Shivers, NP   Brief Narrative:  23 y.o. female with medical history significant for anxiety, depression, and anemia admitted for presyncope likely due to severe hypotension due to sepsis  11/18: Admitted by [facility] for severe sepsis due to UTI. 11/19: Hypotensive despite IVF resuscitation, peripheral Levophed initiated.  PCCM took over service. Ruled out PE given persistent tachycardia and intermittent chest pain/discomfort. 11/20: transferred to [facility], ID c/s, transfer to med-sirg   Assessment & Plan:   Principal Problem:   Sepsis due to gram-negative UTI [facility]) Active Problems:   Anxiety and depression   Septic shock (HCC)   #Shock: Hypovolemic + Septic #Tachycardia, suspect compensatory in nature - Sepsis due to UTI - urine c/s growing GNR -initially required pressors on admission. Weaned off now. - was also on midodrine - stopped now. -Cortisol normal: 14.1 -normal Echocardiogram  -D-dimer elevated, venous US  negative for DVT ~ CTA ruled out PE -neg Urine drug screen - c/s ID - continue Zosyn for now - Respi panel neg -LFT's and lipase negative; urine pregnancy test negative  -CT Abdomen & Pelvis negative for acute abnormality    #Mild Hypokalemia, Low Phos, Mg -Pharmacy c/s for assistance with electrolyte replacement   #Anemia #Thrombocytopenia, suspect due to sepsis PMHx: Anemia  -stable   #Anxiety #Depression -currently not on meds -Previously took Zoloft , but no longer taking    DVT prophylaxis: Lovenox enoxaparin (LOVENOX) injection 40 mg Start: 10/16/24 2330     Code Status: (Full code Family Communication: None at bedside Disposition Plan: possible DC in next 1-2 days depending on clinical condition   Consultants:  ID    Antimicrobials:  Zosyn    Subjective:  Continues to spike fever, tachycardic, tired of alarm going off in ICU  Objective: Vitals:    10/18/24 1800 10/18/24 1900 10/18/24 1930 10/18/24 2000  BP: 129/75 108/60  115/61  Pulse: (!) 109 (!) 108 (!) 110 (!) 106  Resp: (!) 27 18 (!) 28 (!) 25  Temp:   (!) 101.4 F (38.6 C)   TempSrc:   Oral   SpO2: 95% 91% 91% (!) 88%  Weight:      Height:        Intake/Output Summary (Last 24 hours) at 10/18/2024 2049 Last data filed at 10/18/2024 1900 Gross per 24 hour  Intake 420.23 ml  Output --  Net 420.23 ml   Filed Weights   10/17/24 0801 10/18/24 0500 10/18/24 1524  Weight: 75.8 kg 43.6 kg 57.1 kg    Examination:  General exam: Appears calm and comfortable  Respiratory system: Clear to auscultation. Respiratory effort normal. Cardiovascular system: S1 & S2 heard, RRR.SABRA No pedal edema. Gastrointestinal system: Abdomen is soft, benign Central nervous system: Alert and oriented. No focal neurological deficits. Extremities: Symmetric 5 x 5 power. Skin: No rashes, lesions or ulcers Psychiatry: Judgement and insight appear normal. Mood & affect appropriate.     Data Reviewed: I have personally reviewed following labs and imaging studies  CBC: Recent Labs  Lab 10/16/24 1855 10/17/24 0433 10/17/24 0822 10/18/24 0452  WBC 13.0* 15.1* 18.0* 11.7*  NEUTROABS  --   --  15.6*  --   HGB 11.4* 8.9* 9.6* 8.7*  HCT 36.9 28.2* 30.6* 28.0*  MCV 82.2 82.0 82.3 81.9  PLT 175 141* 150 158   Basic Metabolic Panel: Recent Labs  Lab 10/16/24 1855 10/17/24 0433 10/17/24 1511 10/18/24 0452  NA 135 136  139 137  K 4.1 3.2* 4.2 3.5  CL 101 108 108 106  CO2 23 21* 19* 20*  GLUCOSE 100* 107* 91 89  BUN 8 9 9 9   CREATININE 0.95 0.90 0.88 0.80  CALCIUM  8.7* 7.2* 7.1* 7.6*  MG  --   --   --  1.4*  PHOS  --   --   --  1.7*   GFR: Estimated Creatinine Clearance: 98.6 mL/min (by C-G formula based on SCr of 0.8 mg/dL). Liver Function Tests: Recent Labs  Lab 10/17/24 0822 10/18/24 0452  AST 22  --   ALT 14  --   ALKPHOS 75  --   BILITOT 0.6  --   PROT 5.5*  --    ALBUMIN 3.3* 2.9*   Recent Labs  Lab 10/17/24 0822  LIPASE 16   No results for input(s): AMMONIA in the last 168 hours. Coagulation Profile: Recent Labs  Lab 10/17/24 0433 10/18/24 0452  INR 1.5* 1.5*   Cardiac Enzymes: No results for input(s): CKTOTAL, CKMB, CKMBINDEX, TROPONINI in the last 168 hours. BNP (last 3 results) Recent Labs    10/17/24 1320 10/18/24 0452  PROBNP 1,513.0* 1,537.0*   HbA1C: No results for input(s): HGBA1C in the last 72 hours. CBG: Recent Labs  Lab 10/17/24 0757  GLUCAP 84    Sepsis Labs: Recent Labs  Lab 10/17/24 0302 10/17/24 0822 10/17/24 1511 10/17/24 1905 10/18/24 0452  PROCALCITON  --  2.01  --   --  9.51  LATICACIDVEN 1.3 2.0* 2.0* 1.9  --     Recent Results (from the past 240 hours)  Resp panel by RT-PCR (RSV, Flu A&B, Covid) Anterior Nasal Swab     Status: None   Collection Time: 10/16/24  6:55 PM   Specimen: Anterior Nasal Swab  Result Value Ref Range Status   SARS Coronavirus 2 by RT PCR NEGATIVE NEGATIVE Final    Comment: (NOTE) SARS-CoV-2 target nucleic acids are NOT DETECTED.  The SARS-CoV-2 RNA is generally detectable in upper respiratory specimens during the acute phase of infection. The lowest concentration of SARS-CoV-2 viral copies this assay can detect is 138 copies/mL. A negative result does not preclude SARS-Cov-2 infection and should not be used as the sole basis for treatment or other patient management decisions. A negative result may occur with  improper specimen collection/handling, submission of specimen other than nasopharyngeal swab, presence of viral mutation(s) within the areas targeted by this assay, and inadequate number of viral copies(<138 copies/mL). A negative result must be combined with clinical observations, patient history, and epidemiological information. The expected result is Negative.  Fact Sheet for Patients:  bloggercourse.com  Fact  Sheet for Healthcare Providers:  seriousbroker.it  This test is no t yet approved or cleared by the [place]  FDA and  has been authorized for detection and/or diagnosis of SARS-CoV-2 by FDA under an Emergency Use Authorization (EUA). This EUA will remain  in effect (meaning this test can be used) for the duration of the COVID-19 declaration under Section 564(b)(1) of the Act, 21 U.S.C.section 360bbb-3(b)(1), unless the authorization is terminated  or revoked sooner.       Influenza A by PCR NEGATIVE NEGATIVE Final   Influenza B by PCR NEGATIVE NEGATIVE Final    Comment: (NOTE) The Xpert Xpress SARS-CoV-2/FLU/RSV plus assay is intended as an aid in the diagnosis of influenza from Nasopharyngeal swab specimens and should not be used as a sole basis for treatment. Nasal washings and aspirates are unacceptable for Xpert  Xpress SARS-CoV-2/FLU/RSV testing.  Fact Sheet for Patients: bloggercourse.com  Fact Sheet for Healthcare Providers: seriousbroker.it  This test is not yet approved or cleared by the [place]  FDA and has been authorized for detection and/or diagnosis of SARS-CoV-2 by FDA under an Emergency Use Authorization (EUA). This EUA will remain in effect (meaning this test can be used) for the duration of the COVID-19 declaration under Section 564(b)(1) of the Act, 21 U.S.C. section 360bbb-3(b)(1), unless the authorization is terminated or revoked.     Resp Syncytial Virus by PCR NEGATIVE NEGATIVE Final    Comment: (NOTE) Fact Sheet for Patients: bloggercourse.com  Fact Sheet for Healthcare Providers: seriousbroker.it  This test is not yet approved or cleared by the [place]  FDA and has been authorized for detection and/or diagnosis of SARS-CoV-2 by FDA under an Emergency Use Authorization (EUA). This EUA will remain in effect  (meaning this test can be used) for the duration of the COVID-19 declaration under Section 564(b)(1) of the Act, 21 U.S.C. section 360bbb-3(b)(1), unless the authorization is terminated or revoked.  Performed at [facility], [address], [place], [place] [address]   Urine Culture (for pregnant, neutropenic or urologic patients or patients with an indwelling urinary catheter)     Status: Abnormal (Preliminary result)   Collection Time: 10/16/24  9:35 PM   Specimen: Urine, Catheterized  Result Value Ref Range Status   Specimen Description   Final    URINE, CATHETERIZED Performed at [facility], [address] [place], [place] [address]    Special Requests   Final    NONE Performed at [facility], [address] [place], [place] [address]    Culture (A)  Final    >=100,000 COLONIES/mL ESCHERICHIA COLI SUSCEPTIBILITIES TO FOLLOW Performed at [facility], [address] [place], [place] [address]    Report Status PENDING  Incomplete  Blood Culture (routine x 2)     Status: None (Preliminary result)   Collection Time: 10/16/24 11:12 PM   Specimen: BLOOD  Result Value Ref Range Status   Specimen Description BLOOD BLOOD LEFT ARM  Final   Special Requests Blood Culture adequate volume  Final   Culture   Final    NO GROWTH 2 DAYS Performed at [facility], [address], [place], [place] [address]    Report Status PENDING  Incomplete  MRSA Next Gen by PCR, Nasal     Status: None   Collection Time: 10/17/24  8:04 AM   Specimen: Nasal Mucosa; Nasal Swab  Result Value Ref Range Status   MRSA by PCR Next Gen NOT DETECTED NOT DETECTED Final    Comment: (NOTE) The GeneXpert MRSA Assay (FDA approved for NASAL specimens only), is one component of a comprehensive MRSA colonization surveillance program. It is not intended to diagnose MRSA infection nor to guide or monitor treatment for MRSA infections. Test performance is not FDA  approved in patients less than 58 years old. Performed at [facility], [address] [place], [place] [address]   Blood Culture (routine x 2)     Status: None (Preliminary result)   Collection Time: 10/17/24  8:22 AM   Specimen: BLOOD  Result Value Ref Range Status   Specimen Description BLOOD BLOOD RIGHT ARM  Final   Special Requests   Final    BOTTLES DRAWN AEROBIC AND ANAEROBIC Blood Culture adequate volume   Culture   Final    NO GROWTH < 24 HOURS Performed  at [facility], [address] [place], [place] [address]    Report Status PENDING  Incomplete  Respiratory (~20 pathogens) panel by PCR     Status: None   Collection Time: 10/17/24  6:05 PM   Specimen: Nasopharyngeal Swab; Respiratory  Result Value Ref Range Status   Adenovirus NOT DETECTED NOT DETECTED Final   Coronavirus 229E NOT DETECTED NOT DETECTED Final    Comment: (NOTE) The Coronavirus on the Respiratory Panel, DOES NOT test for the novel  Coronavirus (2019 nCoV)    Coronavirus HKU1 NOT DETECTED NOT DETECTED Final   Coronavirus NL63 NOT DETECTED NOT DETECTED Final   Coronavirus OC43 NOT DETECTED NOT DETECTED Final   Metapneumovirus NOT DETECTED NOT DETECTED Final   Rhinovirus / Enterovirus NOT DETECTED NOT DETECTED Final   Influenza A NOT DETECTED NOT DETECTED Final   Influenza B NOT DETECTED NOT DETECTED Final   Parainfluenza Virus 1 NOT DETECTED NOT DETECTED Final   Parainfluenza Virus 2 NOT DETECTED NOT DETECTED Final   Parainfluenza Virus 3 NOT DETECTED NOT DETECTED Final   Parainfluenza Virus 4 NOT DETECTED NOT DETECTED Final   Respiratory Syncytial Virus NOT DETECTED NOT DETECTED Final   Bordetella pertussis NOT DETECTED NOT DETECTED Final   Bordetella Parapertussis NOT DETECTED NOT DETECTED Final   Chlamydophila pneumoniae NOT DETECTED NOT DETECTED Final   Mycoplasma pneumoniae NOT DETECTED NOT DETECTED Final    Comment: Performed at [facility], [address]  [place], [place] [address]         Radiology Studies: ECHOCARDIOGRAM COMPLETE Result Date: 10/18/2024    ECHOCARDIOGRAM REPORT   Patient Name:   Carolyn Williams Date of Exam: 10/18/2024 Medical Rec #:  [id number]     Height:       66.0 in Accession #:    [id number]    Weight:       96.1 lb Date of Birth:  Jan 07, 2001      BSA:          1.465 m Patient Age:    23 years      BP:           102/67 mmHg Patient Gender: F             HR:           124 bpm. Exam Location:  [facility] Procedure: 2D Echo, Cardiac Doppler and Color Doppler (Both Spectral and Color            Flow Doppler were utilized during procedure). Indications:     Shock R57.9  History:         Patient has no prior history of Echocardiogram examinations.                  Anxiety.  Sonographer:     Christopher Furnace Referring Phys:  [id number] INGE JONETTA LECHER Diagnosing Phys: Evalene Lunger MD IMPRESSIONS  1. Left ventricular ejection fraction, by estimation, is 60 to 65%. The left ventricle has normal function. The left ventricle has no regional wall motion abnormalities. Left ventricular diastolic parameters are indeterminate.  2. Right ventricular systolic function is normal. The right ventricular size is normal. There is normal pulmonary artery systolic pressure. The estimated right ventricular systolic pressure is 17.2 mmHg.  3. The mitral valve is normal in structure. No evidence of mitral valve regurgitation. No evidence of mitral stenosis.  4. The aortic valve is normal in structure. Aortic valve regurgitation is not visualized. No aortic stenosis is  present.  5. The inferior vena cava is normal in size with greater than 50% respiratory variability, suggesting right atrial pressure of 3 mmHg. FINDINGS  Left Ventricle: Left ventricular ejection fraction, by estimation, is 60 to 65%. The left ventricle has normal function. The left ventricle has no regional wall motion abnormalities. Strain was performed and the global longitudinal strain is indeterminate. The left  ventricular internal cavity size was normal in size. There is no left ventricular hypertrophy. Left ventricular diastolic parameters are indeterminate. Right Ventricle: The right ventricular size is normal. No increase in right ventricular wall thickness. Right ventricular systolic function is normal. There is normal pulmonary artery systolic pressure. The tricuspid regurgitant velocity is 1.75 m/s, and  with an assumed right atrial pressure of 5 mmHg, the estimated right ventricular systolic pressure is 17.2 mmHg. Left Atrium: Left atrial size was normal in size. Right Atrium: Right atrial size was normal in size. Pericardium: There is no evidence of pericardial effusion. Mitral Valve: The mitral valve is normal in structure. No evidence of mitral valve regurgitation. No evidence of mitral valve stenosis. Tricuspid Valve: The tricuspid valve is normal in structure. Tricuspid valve regurgitation is mild . No evidence of tricuspid stenosis. Aortic Valve: The aortic valve is normal in structure. Aortic valve regurgitation is not visualized. No aortic stenosis is present. Aortic valve mean gradient measures 3.0 mmHg. Aortic valve peak gradient measures 4.8 mmHg. Aortic valve area, by VTI measures 3.12 cm. Pulmonic Valve: The pulmonic valve was normal in structure. Pulmonic valve regurgitation is not visualized. No evidence of pulmonic stenosis. Aorta: The aortic root is normal in size and structure. Venous: The inferior vena cava is normal in size with greater than 50% respiratory variability, suggesting right atrial pressure of 3 mmHg. IAS/Shunts: No atrial level shunt detected by color flow Doppler. Additional Comments: 3D was performed not requiring image post processing on an independent workstation and was indeterminate.  LEFT VENTRICLE PLAX 2D LVIDd:         4.10 cm   Diastology LVIDs:         2.30 cm   LV e' medial:    15.90 cm/s LV PW:         0.90 cm   LV E/e' medial:  7.8 LV IVS:        1.10 cm   LV e'  lateral:   19.50 cm/s LVOT diam:     2.00 cm   LV E/e' lateral: 6.4 LV SV:         54 LV SV Index:   37 LVOT Area:     3.14 cm LV IVRT:       55 msec  RIGHT VENTRICLE RV Basal diam:  3.60 cm     PULMONARY VEINS RV Mid diam:    3.20 cm     Diastolic Velocity: 35.90 cm/s RV S prime:     16.10 cm/s  S/D Velocity:       0.60 TAPSE (M-mode): 3.2 cm      Systolic Velocity:  21.00 cm/s LEFT ATRIUM             Index        RIGHT ATRIUM           Index LA diam:        2.80 cm 1.91 cm/m   RA Area:     13.10 cm LA Vol (A2C):   26.3 ml 17.96 ml/m  RA Volume:   34.10 ml  23.28 ml/m LA  Vol (A4C):   29.7 ml 20.28 ml/m LA Biplane Vol: 28.0 ml 19.12 ml/m  AORTIC VALVE AV Area (Vmax):    3.03 cm AV Area (Vmean):   3.08 cm AV Area (VTI):     3.12 cm AV Vmax:           110.00 cm/s AV Vmean:          73.500 cm/s AV VTI:            0.174 m AV Peak Grad:      4.8 mmHg AV Mean Grad:      3.0 mmHg LVOT Vmax:         106.00 cm/s LVOT Vmean:        72.100 cm/s LVOT VTI:          0.173 m LVOT/AV VTI ratio: 0.99  AORTA Ao Root diam: 2.70 cm MITRAL VALVE                TRICUSPID VALVE MV Area (PHT): 6.83 cm     TR Peak grad:   12.2 mmHg MV Decel Time: 111 msec     TR Vmax:        175.00 cm/s MV E velocity: 124.00 cm/s MV A velocity: 85.40 cm/s   SHUNTS MV E/A ratio:  1.45         Systemic VTI:  0.17 m                             Systemic Diam: 2.00 cm Evalene Lunger MD Electronically signed by Evalene Lunger MD Signature Date/Time: 10/18/2024/3:07:29 PM    Final    CT Angio Chest Pulmonary Embolism (PE) W or WO Contrast Result Date: 10/17/2024 CLINICAL DATA:  Positive D-dimer. EXAM: CT ANGIOGRAPHY CHEST WITH CONTRAST TECHNIQUE: Multidetector CT imaging of the chest was performed using the standard protocol during bolus administration of intravenous contrast. Multiplanar CT image reconstructions and MIPs were obtained to evaluate the vascular anatomy. RADIATION DOSE REDUCTION: This exam was performed according to the departmental  dose-optimization program which includes automated exposure control, adjustment of the mA and/or kV according to patient size and/or use of iterative reconstruction technique. CONTRAST:  75mL OMNIPAQUE  IOHEXOL  350 MG/ML SOLN COMPARISON:  CT chest 09/18/2024 FINDINGS: Cardiovascular: Satisfactory opacification of the pulmonary arteries to the segmental level. No evidence of pulmonary embolism. Normal heart size. No pericardial effusion. Mediastinum/Nodes: There are nonenlarged bilateral hilar lymph nodes which are new from prior. There is some edema in the right paratracheal region which is new from prior. Visualized esophagus and thyroid  gland are within normal limits. Lungs/Pleura: There are small bilateral pleural effusions. There is dependent atelectasis in the bilateral lower lobes. There is smooth interlobular septal thickening in the lung bases. There is bilateral lower lobe peribronchial wall thickening. Upper Abdomen: The spleen is mildly enlarged, unchanged. Musculoskeletal: No chest wall abnormality. No acute or significant osseous findings. Review of the MIP images confirms the above findings. IMPRESSION: 1. No evidence for pulmonary embolism. 2. Small bilateral pleural effusions with interstitial edema. 3. Bilateral lower lobe peribronchial wall thickening and edema in the right paratracheal region. Findings may be related to pulmonary edema or infection. 4. Nonenlarged bilateral hilar lymph nodes are likely reactive. 5. Stable mild splenomegaly. Electronically Signed   By: Greig Pique M.D.   On: 10/17/2024 17:07   US  Venous Img Lower Bilateral (DVT) Result Date: 10/17/2024 CLINICAL DATA:  Dehydration, sepsis, shock EXAM: BILATERAL LOWER EXTREMITY  VENOUS DOPPLER ULTRASOUND TECHNIQUE: Gray-scale sonography with graded compression, as well as color Doppler and duplex ultrasound were performed to evaluate the lower extremity deep venous systems from the level of the common femoral vein and including  the common femoral, femoral, profunda femoral, popliteal and calf veins including the posterior tibial, peroneal and gastrocnemius veins when visible. Spectral Doppler was utilized to evaluate flow at rest and with distal augmentation maneuvers in the common femoral, femoral and popliteal veins. COMPARISON:  None Available. FINDINGS: RIGHT LOWER EXTREMITY Common Femoral Vein: No evidence of thrombus. Normal compressibility, respiratory phasicity and response to augmentation. Saphenofemoral Junction: No evidence of thrombus. Normal compressibility and flow on color Doppler imaging. Profunda Femoral Vein: No evidence of thrombus. Normal compressibility and flow on color Doppler imaging. Femoral Vein: No evidence of thrombus. Normal compressibility, respiratory phasicity and response to augmentation. Popliteal Vein: No evidence of thrombus. Normal compressibility, respiratory phasicity and response to augmentation. Calf Veins: No evidence of thrombus. Normal compressibility and flow on color Doppler imaging. LEFT LOWER EXTREMITY Common Femoral Vein: No evidence of thrombus. Normal compressibility, respiratory phasicity and response to augmentation. Saphenofemoral Junction: No evidence of thrombus. Normal compressibility and flow on color Doppler imaging. Profunda Femoral Vein: No evidence of thrombus. Normal compressibility and flow on color Doppler imaging. Femoral Vein: No evidence of thrombus. Normal compressibility, respiratory phasicity and response to augmentation. Popliteal Vein: No evidence of thrombus. Normal compressibility, respiratory phasicity and response to augmentation. Calf Veins: No evidence of thrombus. Normal compressibility and flow on color Doppler imaging. IMPRESSION: No evidence of deep venous thrombosis in either lower extremity. Electronically Signed   By: CHRISTELLA.  Shick M.D.   On: 10/17/2024 12:39   CT ABDOMEN PELVIS WO CONTRAST Result Date: 10/17/2024 EXAM: CT ABDOMEN AND PELVIS WITHOUT  CONTRAST 10/17/2024 09:25:59 AM TECHNIQUE: CT of the abdomen and pelvis was performed without the administration of intravenous contrast. Multiplanar reformatted images are provided for review. Automated exposure control, iterative reconstruction, and/or weight-based adjustment of the mA/kV was utilized to reduce the radiation dose to as low as reasonably achievable. COMPARISON: None available. CLINICAL HISTORY: Sepsis FINDINGS: LOWER CHEST: No acute abnormality. LIVER: The liver is unremarkable. GALLBLADDER AND BILE DUCTS: Gallbladder is unremarkable. No biliary ductal dilatation. SPLEEN: No acute abnormality. PANCREAS: No acute abnormality. ADRENAL GLANDS: No acute abnormality. KIDNEYS, URETERS AND BLADDER: No stones in the kidneys or ureters. No hydronephrosis. No perinephric or periureteral stranding. Urinary bladder is unremarkable. GI AND BOWEL: Stomach demonstrates no acute abnormality. There is no bowel obstruction. PERITONEUM AND RETROPERITONEUM: No ascites. No free air. VASCULATURE: Aorta is normal in caliber. LYMPH NODES: No lymphadenopathy. REPRODUCTIVE ORGANS: No acute abnormality. BONES AND SOFT TISSUES: No acute osseous abnormality. No focal soft tissue abnormality. IMPRESSION: 1. No acute findings in the abdomen or pelvis. Electronically signed by: Lynwood Seip MD 10/17/2024 10:03 AM EST RP Workstation: [id number]        Scheduled Meds:  Chlorhexidine  Gluconate Cloth  6 each Topical Daily   diazepam   2.5 mg Intravenous Once   enoxaparin  (LOVENOX ) injection  40 mg Subcutaneous Q24H   feeding supplement  237 mL Oral BID BM   metoprolol  tartrate  12.5 mg Oral BID   multivitamin with minerals  1 tablet Oral Daily   thiamine   100 mg Oral Daily   Continuous Infusions:  piperacillin -tazobactam (ZOSYN )  IV Stopped (10/18/24 1725)     LOS: 2 days    Time spent: 35 mins    Cresencio Fairly, MD [facility] Pager [phone]  If 7PM-7AM, please contact  night-coverage [email or web address]  10/18/2024, 8:49 PM

## 2024-10-18 NOTE — Progress Notes (Addendum)
 PHARMACY CONSULT NOTE - FOLLOW UP  Pharmacy Consult for Electrolyte Monitoring and Replacement   Recent Labs: Potassium (mmol/L)  Date Value  10/18/2024 3.5   Magnesium (mg/dL)  Date Value  88/79/7974 1.4 (L)   Calcium (mg/dL)  Date Value  88/79/7974 7.6 (L)   Albumin (g/dL)  Date Value  88/79/7974 2.9 (L)   Phosphorus (mg/dL)  Date Value  88/79/7974 1.7 (L)   Sodium (mmol/L)  Date Value  10/18/2024 137     Assessment: Caucasian female with medical history significant for anxiety, depression, and anemia, who presented to the emergency room with acute onset of syncope and hypotension / UTI. Pharmacy is asked to follow and replace electrolytes  Goal of Therapy:  Electrolytes WNL  Plan:  ---30 mmol IV potassium phosphate x 1 (contains 44 mEq IV potassium) ---4 grams IV magnesium sulfate x 1 ---repeat BMP and recheck electrolytes in am  Adriana JONETTA Bolster ,PharmD Clinical Pharmacist 10/18/2024 7:17 AM

## 2024-10-18 NOTE — Plan of Care (Signed)
  Problem: Fluid Volume: Goal: Hemodynamic stability will improve Outcome: Progressing   Problem: Respiratory: Goal: Ability to maintain adequate ventilation will improve Outcome: Progressing   Problem: Clinical Measurements: Goal: Ability to maintain clinical measurements within normal limits will improve Outcome: Progressing Goal: Respiratory complications will improve Outcome: Progressing Goal: Cardiovascular complication will be avoided Outcome: Progressing   Problem: Activity: Goal: Risk for activity intolerance will decrease Outcome: Progressing   Problem: Pain Managment: Goal: General experience of comfort will improve and/or be controlled Outcome: Progressing   Problem: Safety: Goal: Ability to remain free from injury will improve Outcome: Progressing   Problem: Clinical Measurements: Goal: Signs and symptoms of infection will decrease Outcome: Progressing   Problem: Respiratory: Goal: Ability to maintain adequate ventilation will improve Outcome: Progressing

## 2024-10-18 NOTE — Progress Notes (Signed)
*  PRELIMINARY RESULTS* Echocardiogram 2D Echocardiogram has been performed.  Carolyn Williams 10/18/2024, 8:43 AM

## 2024-10-18 NOTE — Plan of Care (Signed)
  Problem: Fluid Volume: Goal: Hemodynamic stability will improve Outcome: Progressing   Problem: Clinical Measurements: Goal: Diagnostic test results will improve Outcome: Progressing Goal: Signs and symptoms of infection will decrease Outcome: Progressing   Problem: Respiratory: Goal: Ability to maintain adequate ventilation will improve Outcome: Progressing   Problem: Education: Goal: Knowledge of General Education information will improve Description: Including pain rating scale, medication(s)/side effects and non-pharmacologic comfort measures Outcome: Progressing   Problem: Coping: Goal: Level of anxiety will decrease Outcome: Progressing   Problem: Clinical Measurements: Goal: Diagnostic test results will improve Outcome: Progressing
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
## 2024-10-19 ENCOUNTER — Other Ambulatory Visit: Payer: Self-pay

## 2024-10-19 DIAGNOSIS — F32A Depression, unspecified: Secondary | ICD-10-CM | POA: Diagnosis not present

## 2024-10-19 DIAGNOSIS — A4150 Gram-negative sepsis, unspecified: Secondary | ICD-10-CM | POA: Diagnosis not present

## 2024-10-19 DIAGNOSIS — R6521 Severe sepsis with septic shock: Secondary | ICD-10-CM | POA: Diagnosis not present

## 2024-10-19 DIAGNOSIS — N390 Urinary tract infection, site not specified: Secondary | ICD-10-CM

## 2024-10-19 DIAGNOSIS — F419 Anxiety disorder, unspecified: Secondary | ICD-10-CM | POA: Diagnosis not present

## 2024-10-19 DIAGNOSIS — A419 Sepsis, unspecified organism: Secondary | ICD-10-CM | POA: Diagnosis not present

## 2024-10-19 LAB — URINE CULTURE: Culture: >=100000 — AB

## 2024-10-19 LAB — RENAL FUNCTION PANEL
Albumin: 2.9 g/dL — ABNORMAL LOW (ref 3.5–5.0)
Anion gap: 9 (ref 5–15)
BUN: 6 mg/dL (ref 6–20)
CO2: 21 mmol/L — ABNORMAL LOW (ref 22–32)
Calcium: 7.5 mg/dL — ABNORMAL LOW (ref 8.9–10.3)
Chloride: 105 mmol/L (ref 98–111)
Creatinine, Ser: 0.75 mg/dL (ref 0.44–1.00)
GFR, Estimated: >60 mL/min (ref >60)
Glucose, Bld: 93 mg/dL (ref 70–99)
Phosphorus: 1.8 mg/dL — ABNORMAL LOW (ref 2.5–4.6)
Potassium: 3.4 mmol/L — ABNORMAL LOW (ref 3.5–5.1)
Sodium: 135 mmol/L (ref 135–145)

## 2024-10-19 LAB — CBC
HCT: 27 % — ABNORMAL LOW (ref 36.0–46.0)
Hemoglobin: 8.4 g/dL — ABNORMAL LOW (ref 12.0–15.0)
MCH: 25.1 pg — ABNORMAL LOW (ref 26.0–34.0)
MCHC: 31.1 g/dL (ref 30.0–36.0)
MCV: 80.8 fL (ref 80.0–100.0)
Platelets: 157 K/uL (ref 150–400)
RBC: 3.34 MIL/uL — ABNORMAL LOW (ref 3.87–5.11)
RDW: 15.4 % (ref 11.5–15.5)
WBC: 9.3 K/uL (ref 4.0–10.5)
nRBC: 0 % (ref 0.0–0.2)

## 2024-10-19 LAB — LEGIONELLA PNEUMOPHILA SEROGP 1 UR AG: L. pneumophila Serogp 1 Ur Ag: NEGATIVE

## 2024-10-19 LAB — HIV ANTIBODY (ROUTINE TESTING W REFLEX): HIV Screen 4th Generation wRfx: NONREACTIVE

## 2024-10-19 LAB — MAGNESIUM: Magnesium: 2.1 mg/dL (ref 1.7–2.4)

## 2024-10-19 MED ORDER — AMOXICILLIN 500 MG PO CAPS
1000.0000 mg | ORAL_CAPSULE | Freq: Three times a day (TID) | ORAL | 0 refills | Status: DC
  Filled 2024-10-19: qty 42, 7d supply, fill #0

## 2024-10-19 MED ORDER — POTASSIUM PHOSPHATES 15 MMOLE/5ML IV SOLN
30.0000 mmol | Freq: Once | INTRAVENOUS | Status: AC
  Administered 2024-10-19: 30 mmol via INTRAVENOUS
  Filled 2024-10-19: qty 10

## 2024-10-19 MED ORDER — AMOXICILLIN 500 MG PO CAPS
1000.0000 mg | ORAL_CAPSULE | Freq: Three times a day (TID) | ORAL | 0 refills | Status: AC
  Filled 2024-10-19: qty 30, 5d supply, fill #0

## 2024-10-19 NOTE — Plan of Care (Signed)

## 2024-10-19 NOTE — Progress Notes (Signed)
 ECOLI UTI with sepsis Discussed on Amoxicillin  1 gram TID to complete 7 days of Rx

## 2024-10-19 NOTE — Progress Notes (Signed)
 PHARMACY CONSULT NOTE - FOLLOW UP  Pharmacy Consult for Electrolyte Monitoring and Replacement   Recent Labs: Potassium (mmol/L)  Date Value  10/19/2024 3.4 (L)   Magnesium  (mg/dL)  Date Value  88/78/7974 2.1   Calcium  (mg/dL)  Date Value  88/78/7974 7.5 (L)   Albumin (g/dL)  Date Value  88/78/7974 2.9 (L)   Phosphorus (mg/dL)  Date Value  88/78/7974 1.8 (L)   Sodium (mmol/L)  Date Value  10/19/2024 135   Corrected Calcium : 8.4 mg/dL  Assessment: Caucasian female with medical history significant for anxiety, depression, and anemia, who presented to the emergency room with acute onset of syncope and hypotension / UTI. Pharmacy is asked to follow and replace electrolytes  Goal of Therapy:  Electrolytes WNL  Plan:  K 3.4, Phos 1.8: Give Kphos 30 mmol IV x1 Check BMP, Phos in AM  Damien Napoleon ,PharmD Clinical Pharmacist 10/19/2024 7:31 AM

## 2024-10-19 NOTE — Plan of Care (Signed)
   Problem: Activity: Goal: Risk for activity intolerance will decrease Outcome: Progressing   Problem: Nutrition: Goal: Adequate nutrition will be maintained Outcome: Progressing   Problem: Coping: Goal: Level of anxiety will decrease Outcome: Progressing   Problem: Safety: Goal: Ability to remain free from injury will improve Outcome: Progressing   Problem: Skin Integrity: Goal: Risk for impaired skin integrity will decrease Outcome: Progressing

## 2024-10-20 NOTE — Discharge Summary (Signed)
 Physician Discharge Summary   Patient: Carolyn Williams MRN: [id number] DOB: 19-Jul-2001  Admit date:     10/16/2024  Discharge date: 10/19/2024  Discharge Physician: Carolyn Williams   PCP: Carolyn Shivers, NP   Recommendations at discharge:   Follow-up with outpatient providers as requested  Discharge Diagnoses: Principal Problem:   Sepsis due to gram-negative UTI [facility]) Active Problems:   Anxiety and depression   Sepsis (HCC)   Urinary tract infection without hematuria  Hospital Course: Assessment and Plan:  23 y.o. female with medical history significant for anxiety, depression, and anemia admitted for presyncope likely due to severe hypotension due to sepsis   11/18: Admitted by [facility] for severe sepsis due to UTI. 11/19: Hypotensive despite IVF resuscitation, peripheral Levophed  initiated.  PCCM took over service. Ruled out PE given persistent tachycardia and intermittent chest pain/discomfort. 11/20: transferred to [facility], ID c/s, transfer to med-sirg     Assessment & Plan:   Principal Problem:   Sepsis due to gram-negative UTI [facility]) Active Problems:   Anxiety and depression   Septic shock (HCC)     #Shock: Hypovolemic + Septic #Tachycardia, suspect compensatory in nature - Sepsis due to E. coli UTI - urine c/s growing E. coli -initially required pressors on admission. Weaned off now. - was also on midodrine  - stopped now. -Cortisol normal: 14.1 -normal Echocardiogram  -D-dimer elevated, venous US  negative for DVT ~ CTA ruled out PE -neg Urine drug screen - Respi panel neg -LFT's and lipase negative; urine pregnancy test negative  -CT Abdomen & Pelvis negative for acute abnormality  She is feeling much better and back to baseline.  After discussion with ID she is being discharged on amoxicillin  1 gram TID to complete 7 days of Rx   #Mild Hypokalemia, Low Phos, Mg Repleted   #Anemia #Thrombocytopenia, suspect due to sepsis PMHx: Anemia  -stable    #Anxiety #Depression -currently not on meds -Previously took Zoloft , but no longer taking      Consultants: ID Disposition: Home Diet recommendation:  Discharge Diet Orders (From admission, onward)     Start     Ordered   10/19/24 0000  Diet - low sodium heart healthy        10/19/24 1110           Carb modified diet DISCHARGE MEDICATION: Allergies as of 10/19/2024   No Known Allergies      Medication List     TAKE these medications    amoxicillin  500 MG capsule Commonly known as: AMOXIL  Take 2 capsules (1,000 mg total) by mouth 3 (three) times daily for 5 days.        Follow-up Information     Carolyn Shivers, NP. Schedule an appointment as soon as possible for a visit in 1 week(s).   Specialty: General Practice Why: [facility] Discharge F/UP Contact information: [address] [place] [address] [phone]                Discharge Exam: Carolyn [facility]   10/18/24 0500 10/18/24 1524 10/19/24 0500  Weight: 43.6 kg 57.1 kg 76.8 kg   General exam: Appears calm and comfortable  Respiratory system: Clear to auscultation. Respiratory effort normal. Cardiovascular system: S1 & S2 heard, RRR.Carolyn [facility] No pedal edema. Gastrointestinal system: Abdomen is soft, benign Central nervous system: Alert and oriented. No focal neurological deficits. Extremities: Symmetric 5 x 5 power. Skin: No rashes, lesions or ulcers Psychiatry: Judgement and insight appear normal. Mood & affect appropriate.   Condition  at discharge: good  The results of significant diagnostics from this hospitalization (including imaging, microbiology, ancillary and laboratory) are listed below for reference.   Imaging Studies: ECHOCARDIOGRAM COMPLETE Result Date: 10/18/2024    ECHOCARDIOGRAM REPORT   Patient Name:   Carolyn Williams Date of Exam: 10/18/2024 Medical Rec #:  [id number]     Height:       66.0 in Accession #:    [id number]    Weight:       96.1 lb Date of Birth:  01-Mar-2001       BSA:          1.465 m Patient Age:    23 years      BP:           102/67 mmHg Patient Gender: F             HR:           124 bpm. Exam Location:  [facility] Procedure: 2D Echo, Cardiac Doppler and Color Doppler (Both Spectral and Color            Flow Doppler were utilized during procedure). Indications:     Shock R57.9  History:         Patient has no prior history of Echocardiogram examinations.                  Anxiety.  Sonographer:     Carolyn [facility] Referring Phys:  [id number] Carolyn [facility] Diagnosing Phys: Carolyn Lunger MD IMPRESSIONS  1. Left ventricular ejection fraction, by estimation, is 60 to 65%. The left ventricle has normal function. The left ventricle has no regional wall motion abnormalities. Left ventricular diastolic parameters are indeterminate.  2. Right ventricular systolic function is normal. The right ventricular size is normal. There is normal pulmonary artery systolic pressure. The estimated right ventricular systolic pressure is 17.2 mmHg.  3. The mitral valve is normal in structure. No evidence of mitral valve regurgitation. No evidence of mitral stenosis.  4. The aortic valve is normal in structure. Aortic valve regurgitation is not visualized. No aortic stenosis is present.  5. The inferior vena cava is normal in size with greater than 50% respiratory variability, suggesting right atrial pressure of 3 mmHg. FINDINGS  Left Ventricle: Left ventricular ejection fraction, by estimation, is 60 to 65%. The left ventricle has normal function. The left ventricle has no regional wall motion abnormalities. Strain was performed and the global longitudinal strain is indeterminate. The left ventricular internal cavity size was normal in size. There is no left ventricular hypertrophy. Left ventricular diastolic parameters are indeterminate. Right Ventricle: The right ventricular size is normal. No increase in right ventricular wall thickness. Right ventricular systolic function is normal. There is  normal pulmonary artery systolic pressure. The tricuspid regurgitant velocity is 1.75 m/s, and  with an assumed right atrial pressure of 5 mmHg, the estimated right ventricular systolic pressure is 17.2 mmHg. Left Atrium: Left atrial size was normal in size. Right Atrium: Right atrial size was normal in size. Pericardium: There is no evidence of pericardial effusion. Mitral Valve: The mitral valve is normal in structure. No evidence of mitral valve regurgitation. No evidence of mitral valve stenosis. Tricuspid Valve: The tricuspid valve is normal in structure. Tricuspid valve regurgitation is mild . No evidence of tricuspid stenosis. Aortic Valve: The aortic valve is normal in structure. Aortic valve regurgitation is not visualized. No aortic stenosis is present. Aortic valve mean gradient measures 3.0 mmHg. Aortic valve peak gradient measures  4.8 mmHg. Aortic valve area, by VTI measures 3.12 cm. Pulmonic Valve: The pulmonic valve was normal in structure. Pulmonic valve regurgitation is not visualized. No evidence of pulmonic stenosis. Aorta: The aortic root is normal in size and structure. Venous: The inferior vena cava is normal in size with greater than 50% respiratory variability, suggesting right atrial pressure of 3 mmHg. IAS/Shunts: No atrial level shunt detected by color flow Doppler. Additional Comments: 3D was performed not requiring image post processing on an independent workstation and was indeterminate.  LEFT VENTRICLE PLAX 2D LVIDd:         4.10 cm   Diastology LVIDs:         2.30 cm   LV e' medial:    15.90 cm/s LV PW:         0.90 cm   LV E/e' medial:  7.8 LV IVS:        1.10 cm   LV e' lateral:   19.50 cm/s LVOT diam:     2.00 cm   LV E/e' lateral: 6.4 LV SV:         54 LV SV Index:   37 LVOT Area:     3.14 cm LV IVRT:       55 msec  RIGHT VENTRICLE RV Basal diam:  3.60 cm     PULMONARY VEINS RV Mid diam:    3.20 cm     Diastolic Velocity: 35.90 cm/s RV S prime:     16.10 cm/s  S/D Velocity:        0.60 TAPSE (M-mode): 3.2 cm      Systolic Velocity:  21.00 cm/s LEFT ATRIUM             Index        RIGHT ATRIUM           Index LA diam:        2.80 cm 1.91 cm/m   RA Area:     13.10 cm LA Vol (A2C):   26.3 ml 17.96 ml/m  RA Volume:   34.10 ml  23.28 ml/m LA Vol (A4C):   29.7 ml 20.28 ml/m LA Biplane Vol: 28.0 ml 19.12 ml/m  AORTIC VALVE AV Area (Vmax):    3.03 cm AV Area (Vmean):   3.08 cm AV Area (VTI):     3.12 cm AV Vmax:           110.00 cm/s AV Vmean:          73.500 cm/s AV VTI:            0.174 m AV Peak Grad:      4.8 mmHg AV Mean Grad:      3.0 mmHg LVOT Vmax:         106.00 cm/s LVOT Vmean:        72.100 cm/s LVOT VTI:          0.173 m LVOT/AV VTI ratio: 0.99  AORTA Ao Root diam: 2.70 cm MITRAL VALVE                TRICUSPID VALVE MV Area (PHT): 6.83 cm     TR Peak grad:   12.2 mmHg MV Decel Time: 111 msec     TR Vmax:        175.00 cm/s MV E velocity: 124.00 cm/s MV A velocity: 85.40 cm/s   SHUNTS MV E/A ratio:  1.45         Systemic VTI:  0.17 m  Systemic Diam: 2.00 cm Carolyn Lunger MD Electronically signed by Carolyn Lunger MD Signature Date/Time: 10/18/2024/3:07:29 PM    Final    CT Angio Chest Pulmonary Embolism (PE) W or WO Contrast Result Date: 10/17/2024 CLINICAL DATA:  Positive D-dimer. EXAM: CT ANGIOGRAPHY CHEST WITH CONTRAST TECHNIQUE: Multidetector CT imaging of the chest was performed using the standard protocol during bolus administration of intravenous contrast. Multiplanar CT image reconstructions and MIPs were obtained to evaluate the vascular anatomy. RADIATION DOSE REDUCTION: This exam was performed according to the departmental dose-optimization program which includes automated exposure control, adjustment of the mA and/or kV according to patient size and/or use of iterative reconstruction technique. CONTRAST:  75mL OMNIPAQUE  IOHEXOL  350 MG/ML SOLN COMPARISON:  CT chest 09/18/2024 FINDINGS: Cardiovascular: Satisfactory opacification of the  pulmonary arteries to the segmental level. No evidence of pulmonary embolism. Normal heart size. No pericardial effusion. Mediastinum/Nodes: There are nonenlarged bilateral hilar lymph nodes which are new from prior. There is some edema in the right paratracheal region which is new from prior. Visualized esophagus and thyroid  gland are within normal limits. Lungs/Pleura: There are small bilateral pleural effusions. There is dependent atelectasis in the bilateral lower lobes. There is smooth interlobular septal thickening in the lung bases. There is bilateral lower lobe peribronchial wall thickening. Upper Abdomen: The spleen is mildly enlarged, unchanged. Musculoskeletal: No chest wall abnormality. No acute or significant osseous findings. Review of the MIP images confirms the above findings. IMPRESSION: 1. No evidence for pulmonary embolism. 2. Small bilateral pleural effusions with interstitial edema. 3. Bilateral lower lobe peribronchial wall thickening and edema in the right paratracheal region. Findings may be related to pulmonary edema or infection. 4. Nonenlarged bilateral hilar lymph nodes are likely reactive. 5. Stable mild splenomegaly. Electronically Signed   By: Greig Pique M.D.   On: 10/17/2024 17:07   US  Venous Img Lower Bilateral (DVT) Result Date: 10/17/2024 CLINICAL DATA:  Dehydration, sepsis, shock EXAM: BILATERAL LOWER EXTREMITY VENOUS DOPPLER ULTRASOUND TECHNIQUE: Gray-scale sonography with graded compression, as well as color Doppler and duplex ultrasound were performed to evaluate the lower extremity deep venous systems from the level of the common femoral vein and including the common femoral, femoral, profunda femoral, popliteal and calf veins including the posterior tibial, peroneal and gastrocnemius veins when visible. Spectral Doppler was utilized to evaluate flow at rest and with distal augmentation maneuvers in the common femoral, femoral and popliteal veins. COMPARISON:  None  Available. FINDINGS: RIGHT LOWER EXTREMITY Common Femoral Vein: No evidence of thrombus. Normal compressibility, respiratory phasicity and response to augmentation. Saphenofemoral Junction: No evidence of thrombus. Normal compressibility and flow on color Doppler imaging. Profunda Femoral Vein: No evidence of thrombus. Normal compressibility and flow on color Doppler imaging. Femoral Vein: No evidence of thrombus. Normal compressibility, respiratory phasicity and response to augmentation. Popliteal Vein: No evidence of thrombus. Normal compressibility, respiratory phasicity and response to augmentation. Calf Veins: No evidence of thrombus. Normal compressibility and flow on color Doppler imaging. LEFT LOWER EXTREMITY Common Femoral Vein: No evidence of thrombus. Normal compressibility, respiratory phasicity and response to augmentation. Saphenofemoral Junction: No evidence of thrombus. Normal compressibility and flow on color Doppler imaging. Profunda Femoral Vein: No evidence of thrombus. Normal compressibility and flow on color Doppler imaging. Femoral Vein: No evidence of thrombus. Normal compressibility, respiratory phasicity and response to augmentation. Popliteal Vein: No evidence of thrombus. Normal compressibility, respiratory phasicity and response to augmentation. Calf Veins: No evidence of thrombus. Normal compressibility and flow on color Doppler imaging. IMPRESSION: No  evidence of deep venous thrombosis in either lower extremity. Electronically Signed   By: CHRISTELLA.  Shick M.D.   On: 10/17/2024 12:39   CT ABDOMEN PELVIS WO CONTRAST Result Date: 10/17/2024 EXAM: CT ABDOMEN AND PELVIS WITHOUT CONTRAST 10/17/2024 09:25:59 AM TECHNIQUE: CT of the abdomen and pelvis was performed without the administration of intravenous contrast. Multiplanar reformatted images are provided for review. Automated exposure control, iterative reconstruction, and/or weight-based adjustment of the mA/kV was utilized to reduce the  radiation dose to as low as reasonably achievable. COMPARISON: None available. CLINICAL HISTORY: Sepsis FINDINGS: LOWER CHEST: No acute abnormality. LIVER: The liver is unremarkable. GALLBLADDER AND BILE DUCTS: Gallbladder is unremarkable. No biliary ductal dilatation. SPLEEN: No acute abnormality. PANCREAS: No acute abnormality. ADRENAL GLANDS: No acute abnormality. KIDNEYS, URETERS AND BLADDER: No stones in the kidneys or ureters. No hydronephrosis. No perinephric or periureteral stranding. Urinary bladder is unremarkable. GI AND BOWEL: Stomach demonstrates no acute abnormality. There is no bowel obstruction. PERITONEUM AND RETROPERITONEUM: No ascites. No free air. VASCULATURE: Aorta is normal in caliber. LYMPH NODES: No lymphadenopathy. REPRODUCTIVE ORGANS: No acute abnormality. BONES AND SOFT TISSUES: No acute osseous abnormality. No focal soft tissue abnormality. IMPRESSION: 1. No acute findings in the abdomen or pelvis. Electronically signed by: Lynwood Seip MD 10/17/2024 10:03 AM EST RP Workstation: [id number]   DG Chest 2 View Result Date: 10/16/2024 CLINICAL DATA:  Dehydration with chest pain and fever. EXAM: CHEST - 2 VIEW COMPARISON:  September 18, 2024 FINDINGS: The heart size and mediastinal contours are within normal limits. Both lungs are clear. The visualized skeletal structures are unremarkable. IMPRESSION: No active cardiopulmonary disease. Electronically Signed   By: Suzen Dials M.D.   On: 10/16/2024 20:11    Microbiology: Results for orders placed or performed during the hospital encounter of 10/16/24  Resp panel by RT-PCR (RSV, Flu A&B, Covid) Anterior Nasal Swab     Status: None   Collection Time: 10/16/24  6:55 PM   Specimen: Anterior Nasal Swab  Result Value Ref Range Status   SARS Coronavirus 2 by RT PCR NEGATIVE NEGATIVE Final    Comment: (NOTE) SARS-CoV-2 target nucleic acids are NOT DETECTED.  The SARS-CoV-2 RNA is generally detectable in upper respiratory specimens  during the acute phase of infection. The lowest concentration of SARS-CoV-2 viral copies this assay can detect is 138 copies/mL. A negative result does not preclude SARS-Cov-2 infection and should not be used as the sole basis for treatment or other patient management decisions. A negative result may occur with  improper specimen collection/handling, submission of specimen other than nasopharyngeal swab, presence of viral mutation(s) within the areas targeted by this assay, and inadequate number of viral copies(<138 copies/mL). A negative result must be combined with clinical observations, patient history, and epidemiological information. The expected result is Negative.  Fact Sheet for Patients:  bloggercourse.com  Fact Sheet for Healthcare Providers:  seriousbroker.it  This test is no t yet approved or cleared by the [place]  FDA and  has been authorized for detection and/or diagnosis of SARS-CoV-2 by FDA under an Emergency Use Authorization (EUA). This EUA will remain  in effect (meaning this test can be used) for the duration of the COVID-19 declaration under Section 564(b)(1) of the Act, 21 U.S.C.section 360bbb-3(b)(1), unless the authorization is terminated  or revoked sooner.       Influenza A by PCR NEGATIVE NEGATIVE Final   Influenza B by PCR NEGATIVE NEGATIVE Final    Comment: (NOTE) The Xpert Xpress SARS-CoV-2/FLU/RSV plus assay  is intended as an aid in the diagnosis of influenza from Nasopharyngeal swab specimens and should not be used as a sole basis for treatment. Nasal washings and aspirates are unacceptable for Xpert Xpress SARS-CoV-2/FLU/RSV testing.  Fact Sheet for Patients: bloggercourse.com  Fact Sheet for Healthcare Providers: seriousbroker.it  This test is not yet approved or cleared by the [place]  FDA and has been authorized for detection  and/or diagnosis of SARS-CoV-2 by FDA under an Emergency Use Authorization (EUA). This EUA will remain in effect (meaning this test can be used) for the duration of the COVID-19 declaration under Section 564(b)(1) of the Act, 21 U.S.C. section 360bbb-3(b)(1), unless the authorization is terminated or revoked.     Resp Syncytial Virus by PCR NEGATIVE NEGATIVE Final    Comment: (NOTE) Fact Sheet for Patients: bloggercourse.com  Fact Sheet for Healthcare Providers: seriousbroker.it  This test is not yet approved or cleared by the [place]  FDA and has been authorized for detection and/or diagnosis of SARS-CoV-2 by FDA under an Emergency Use Authorization (EUA). This EUA will remain in effect (meaning this test can be used) for the duration of the COVID-19 declaration under Section 564(b)(1) of the Act, 21 U.S.C. section 360bbb-3(b)(1), unless the authorization is terminated or revoked.  Performed at [facility], [address], [place], [place] [address]   Urine Culture (for pregnant, neutropenic or urologic patients or patients with an indwelling urinary catheter)     Status: Abnormal   Collection Time: 10/16/24  9:35 PM   Specimen: Urine, Catheterized  Result Value Ref Range Status   Specimen Description   Final    URINE, CATHETERIZED Performed at [facility], [address] [place], [place] [address]    Special Requests   Final    NONE Performed at [facility], [address] [place], [place] [address]    Culture >=100,000 COLONIES/mL ESCHERICHIA COLI (A)  Final   Report Status 10/19/2024 FINAL  Final   Organism ID, Bacteria ESCHERICHIA COLI (A)  Final      Susceptibility   Escherichia coli - MIC*    AMPICILLIN 4 SENSITIVE Sensitive     CEFAZOLIN (URINE) Value in next row Sensitive      4 SENSITIVEThis is a modified FDA-approved test that has been validated and its performance  characteristics determined by the reporting laboratory.  This laboratory is certified under the Clinical Laboratory Improvement Amendments CLIA as qualified to perform high complexity clinical laboratory testing.    CEFEPIME Value in next row Sensitive      4 SENSITIVEThis is a modified FDA-approved test that has been validated and its performance characteristics determined by the reporting laboratory.  This laboratory is certified under the Clinical Laboratory Improvement Amendments CLIA as qualified to perform high complexity clinical laboratory testing.    ERTAPENEM Value in next row Sensitive      4 SENSITIVEThis is a modified FDA-approved test that has been validated and its performance characteristics determined by the reporting laboratory.  This laboratory is certified under the Clinical Laboratory Improvement Amendments CLIA as qualified to perform high complexity clinical laboratory testing.    CEFTRIAXONE  Value in next row Sensitive      4 SENSITIVEThis is a modified FDA-approved test that has been validated and its performance characteristics determined by the reporting laboratory.  This laboratory is certified under the Clinical Laboratory Improvement Amendments CLIA as qualified to perform high complexity clinical laboratory testing.    CIPROFLOXACIN Value in next row Sensitive  4 SENSITIVEThis is a modified FDA-approved test that has been validated and its performance characteristics determined by the reporting laboratory.  This laboratory is certified under the Clinical Laboratory Improvement Amendments CLIA as qualified to perform high complexity clinical laboratory testing.    GENTAMICIN Value in next row Sensitive      4 SENSITIVEThis is a modified FDA-approved test that has been validated and its performance characteristics determined by the reporting laboratory.  This laboratory is certified under the Clinical Laboratory Improvement Amendments CLIA as qualified to perform high  complexity clinical laboratory testing.    NITROFURANTOIN Value in next row Sensitive      4 SENSITIVEThis is a modified FDA-approved test that has been validated and its performance characteristics determined by the reporting laboratory.  This laboratory is certified under the Clinical Laboratory Improvement Amendments CLIA as qualified to perform high complexity clinical laboratory testing.    TRIMETH/SULFA Value in next row Sensitive      4 SENSITIVEThis is a modified FDA-approved test that has been validated and its performance characteristics determined by the reporting laboratory.  This laboratory is certified under the Clinical Laboratory Improvement Amendments CLIA as qualified to perform high complexity clinical laboratory testing.    AMPICILLIN/SULBACTAM Value in next row Sensitive      4 SENSITIVEThis is a modified FDA-approved test that has been validated and its performance characteristics determined by the reporting laboratory.  This laboratory is certified under the Clinical Laboratory Improvement Amendments CLIA as qualified to perform high complexity clinical laboratory testing.    PIP/TAZO Value in next row Sensitive      <=4 SENSITIVEThis is a modified FDA-approved test that has been validated and its performance characteristics determined by the reporting laboratory.  This laboratory is certified under the Clinical Laboratory Improvement Amendments CLIA as qualified to perform high complexity clinical laboratory testing.    MEROPENEM Value in next row Sensitive      <=4 SENSITIVEThis is a modified FDA-approved test that has been validated and its performance characteristics determined by the reporting laboratory.  This laboratory is certified under the Clinical Laboratory Improvement Amendments CLIA as qualified to perform high complexity clinical laboratory testing.    * >=100,000 COLONIES/mL ESCHERICHIA COLI  Blood Culture (routine x 2)     Status: None (Preliminary result)    Collection Time: 10/16/24 11:12 PM   Specimen: BLOOD  Result Value Ref Range Status   Specimen Description BLOOD BLOOD LEFT ARM  Final   Special Requests Blood Culture adequate volume  Final   Culture   Final    NO GROWTH 4 DAYS Performed at [facility], [address], [place], [place] [address]    Report Status PENDING  Incomplete  MRSA Next Gen by PCR, Nasal     Status: None   Collection Time: 10/17/24  8:04 AM   Specimen: Nasal Mucosa; Nasal Swab  Result Value Ref Range Status   MRSA by PCR Next Gen NOT DETECTED NOT DETECTED Final    Comment: (NOTE) The GeneXpert MRSA Assay (FDA approved for NASAL specimens only), is one component of a comprehensive MRSA colonization surveillance program. It is not intended to diagnose MRSA infection nor to guide or monitor treatment for MRSA infections. Test performance is not FDA approved in patients less than 30 years old. Performed at [facility], [address] [place], [place] [address]   Blood Culture (routine x 2)     Status: None (Preliminary result)   Collection Time: 10/17/24  8:22  AM   Specimen: BLOOD  Result Value Ref Range Status   Specimen Description BLOOD BLOOD RIGHT ARM  Final   Special Requests   Final    BOTTLES DRAWN AEROBIC AND ANAEROBIC Blood Culture adequate volume   Culture   Final    NO GROWTH 3 DAYS Performed at [facility], [address] [place], [place] [address]    Report Status PENDING  Incomplete  Respiratory (~20 pathogens) panel by PCR     Status: None   Collection Time: 10/17/24  6:05 PM   Specimen: Nasopharyngeal Swab; Respiratory  Result Value Ref Range Status   Adenovirus NOT DETECTED NOT DETECTED Final   Coronavirus 229E NOT DETECTED NOT DETECTED Final    Comment: (NOTE) The Coronavirus on the Respiratory Panel, DOES NOT test for the novel  Coronavirus (2019 nCoV)    Coronavirus HKU1 NOT DETECTED NOT DETECTED Final   Coronavirus NL63 NOT DETECTED NOT DETECTED  Final   Coronavirus OC43 NOT DETECTED NOT DETECTED Final   Metapneumovirus NOT DETECTED NOT DETECTED Final   Rhinovirus / Enterovirus NOT DETECTED NOT DETECTED Final   Influenza A NOT DETECTED NOT DETECTED Final   Influenza B NOT DETECTED NOT DETECTED Final   Parainfluenza Virus 1 NOT DETECTED NOT DETECTED Final   Parainfluenza Virus 2 NOT DETECTED NOT DETECTED Final   Parainfluenza Virus 3 NOT DETECTED NOT DETECTED Final   Parainfluenza Virus 4 NOT DETECTED NOT DETECTED Final   Respiratory Syncytial Virus NOT DETECTED NOT DETECTED Final   Bordetella pertussis NOT DETECTED NOT DETECTED Final   Bordetella Parapertussis NOT DETECTED NOT DETECTED Final   Chlamydophila pneumoniae NOT DETECTED NOT DETECTED Final   Mycoplasma pneumoniae NOT DETECTED NOT DETECTED Final    Comment: Performed at [facility], [address], [place], [place] [address]    Labs: CBC: Recent Labs  Lab 10/16/24 1855 10/17/24 0433 10/17/24 0822 10/18/24 0452 10/19/24 0339  WBC 13.0* 15.1* 18.0* 11.7* 9.3  NEUTROABS  --   --  15.6*  --   --   HGB 11.4* 8.9* 9.6* 8.7* 8.4*  HCT 36.9 28.2* 30.6* 28.0* 27.0*  MCV 82.2 82.0 82.3 81.9 80.8  PLT 175 141* 150 158 157   Basic Metabolic Panel: Recent Labs  Lab 10/16/24 1855 10/17/24 0433 10/17/24 1511 10/18/24 0452 10/19/24 0339  NA 135 136 139 137 135  K 4.1 3.2* 4.2 3.5 3.4*  CL 101 108 108 106 105  CO2 23 21* 19* 20* 21*  GLUCOSE 100* 107* 91 89 93  BUN 8 9 9 9 6   CREATININE 0.95 0.90 0.88 0.80 0.75  CALCIUM  8.7* 7.2* 7.1* 7.6* 7.5*  MG  --   --   --  1.4* 2.1  PHOS  --   --   --  1.7* 1.8*   Liver Function Tests: Recent Labs  Lab 10/17/24 0822 10/18/24 0452 10/19/24 0339  AST 22  --   --   ALT 14  --   --   ALKPHOS 75  --   --   BILITOT 0.6  --   --   PROT 5.5*  --   --   ALBUMIN 3.3* 2.9* 2.9*   CBG: Recent Labs  Lab 10/17/24 0757  GLUCAP 84    Discharge time spent: greater than 30 minutes.  Signed: Cresencio Fairly, MD [facility] 10/20/2024

## 2024-10-21 LAB — CULTURE, BLOOD (ROUTINE X 2)
Culture: NO GROWTH
Special Requests: ADEQUATE

## 2024-10-22 ENCOUNTER — Telehealth: Payer: Self-pay | Admitting: *Deleted

## 2024-10-22 LAB — CULTURE, BLOOD (ROUTINE X 2)
Culture: NO GROWTH
Special Requests: ADEQUATE

## 2024-10-22 NOTE — Transitions of Care (Post Inpatient/ED Visit) (Signed)
 10/22/2024  Name: Carolyn [place] MRN: [id number] DOB: [birth year]  Today's TOC FU Call Status: Today's TOC FU Call Status:: Successful TOC FU Call Completed TOC FU Call Complete Date: 10/22/24  Patient's Name and Date of Birth confirmed. Name, DOB  Transition Care Management Follow-up Telephone Call Date of Discharge: 10/19/24 Discharge Facility: [facility]) Type of Discharge: Inpatient Admission Primary Inpatient Discharge Diagnosis:: UTI How have you been since you were released from the hospital?: Better (I am fine now and not having any problems at all; don't take any medication on a regular basis and am healthy.  I don't need any follow up calls and appreciate you getting this appointment scheduled for me) Any questions or concerns?: No  Items Reviewed: Did you receive and understand the discharge instructions provided?: Yes (thoroughly reviewed with patient who verbalizes good understanding of same) Medications obtained,verified, and reconciled?: Yes (Medications Reviewed) (Full medication reconciliation/ review completed; no concerns or discrepancies identified; confirmed patient obtained/ is taking all newly Rx'd medications as instructed; self-manages medications and denies questions/ concerns around medications today) Any new allergies since your discharge?: No Dietary orders reviewed?: Yes Type of Diet Ordered:: Regular Do you have support at home?: Yes People in Home [RPT]:  (does not specify whom she lives with: I live with family members) Name of Support/Comfort Primary Source: Reports independent in self-care activities; resides with unspecified family members; -  assists as/ if needed/ indicated  Medications Reviewed Today: Medications Reviewed Today     Reviewed by Tyquavious Gamel M, RN (Registered Nurse) on 10/22/24 at 1052  Med List Status: <None>   Medication Order Taking? Sig Documenting Provider Last Dose Status Informant   amoxicillin  (AMOXIL ) 500 MG capsule [id number] Yes Take 2 capsules (1,000 mg total) by mouth 3 (three) times daily for 5 days. Maree Hue, MD  Active            Home Care and Equipment/Supplies: Were Home Health Services Ordered?: No Any new equipment or medical supplies ordered?: No  Functional Questionnaire: Do you need assistance with bathing/showering or dressing?: No Do you need assistance with meal preparation?: No Do you need assistance with eating?: No Do you have difficulty maintaining continence: No Do you need assistance with getting out of bed/getting out of a chair/moving?: No Do you have difficulty managing or taking your medications?: No  Follow up appointments reviewed: PCP Follow-up appointment confirmed?: Yes (care coordination outreach in real-time with scheduling care guide to successfully schedule hospital follow up PCP appointment 10/30/24) Date of PCP follow-up appointment?: 10/30/24 Follow-up Provider: PCP: Carrol Aurora NP [facility] Follow-up appointment confirmed?: NA (verified not indicated per hospital discharging provider discharge notes) Do you need transportation to your follow-up appointment?: No Do you understand care options if your condition(s) worsen?: Yes-patient verbalized understanding  SDOH Interventions Today    Flowsheet Row Most Recent Value  SDOH Interventions   Food Insecurity Interventions Intervention Not Indicated  Housing Interventions Intervention Not Indicated  Transportation Interventions Intervention Not Indicated  Utilities Interventions Intervention Not Indicated   See TOC assessment tabs for additional assessment/ TOC intervention information discussed potential benefits of adding OTC pro-biotic in setting of antibiotic therapy- expected side effects, and encouraged patient to discuss with PCP to determine if indicated   Patient declines need for ongoing/ further care management/ coordination outreach; declines  enrollment in 30-day TOC program- declines taking my direct phone number should needs/ concerns arise post-TOC call   Pls call/ message for questions,  Brick Ketcher Mckinney  Abelino, RN, BSN, Media Planner  Transitions of Care  [facility] Population Health  [place] [phone]: direct office

## 2024-10-30 ENCOUNTER — Encounter: Payer: Self-pay | Admitting: General Practice

## 2024-10-30 ENCOUNTER — Ambulatory Visit: Admitting: General Practice

## 2024-10-30 VITALS — BP 110/80 | HR 96 | Temp 98.7°F | Ht 66.0 in | Wt 159.0 lb

## 2024-10-30 DIAGNOSIS — F419 Anxiety disorder, unspecified: Secondary | ICD-10-CM

## 2024-10-30 DIAGNOSIS — Z09 Encounter for follow-up examination after completed treatment for conditions other than malignant neoplasm: Secondary | ICD-10-CM | POA: Insufficient documentation

## 2024-10-30 DIAGNOSIS — D729 Disorder of white blood cells, unspecified: Secondary | ICD-10-CM | POA: Diagnosis not present

## 2024-10-30 DIAGNOSIS — E876 Hypokalemia: Secondary | ICD-10-CM | POA: Insufficient documentation

## 2024-10-30 DIAGNOSIS — F32A Depression, unspecified: Secondary | ICD-10-CM | POA: Diagnosis not present

## 2024-10-30 NOTE — Patient Instructions (Signed)
 Stop by the lab prior to leaving today. I will notify you of your results once received.   Continue drinking plenty of fluids and monitoring symptoms.   Follow up in march for physical as scheduled.  It was a pleasure to see you today!
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
## 2024-10-31 LAB — BASIC METABOLIC PANEL WITH GFR
BUN: 14 mg/dL (ref 6–23)
CO2: 26 meq/L (ref 19–32)
Calcium: 9.5 mg/dL (ref 8.4–10.5)
Chloride: 102 meq/L (ref 96–112)
Creatinine, Ser: 0.82 mg/dL (ref 0.40–1.20)
GFR: 100.47 mL/min (ref >60.00)
Glucose, Bld: 70 mg/dL (ref 70–99)
Potassium: 4.2 meq/L (ref 3.5–5.1)
Sodium: 140 meq/L (ref 135–145)

## 2024-10-31 LAB — CBC
HCT: 34.7 % — ABNORMAL LOW (ref 36.0–46.0)
Hemoglobin: 11.2 g/dL — ABNORMAL LOW (ref 12.0–15.0)
MCHC: 32.2 g/dL (ref 30.0–36.0)
MCV: 78.6 fl (ref 78.0–100.0)
Platelets: 417 K/uL — ABNORMAL HIGH (ref 150.0–400.0)
RBC: 4.41 Mil/uL (ref 3.87–5.11)
RDW: 16.6 % — ABNORMAL HIGH (ref 11.5–15.5)
WBC: 6 K/uL (ref 4.0–10.5)

## 2024-11-01 ENCOUNTER — Ambulatory Visit: Payer: Self-pay | Admitting: General Practice

## 2024-12-11 ENCOUNTER — Ambulatory Visit (HOSPITAL_COMMUNITY): Payer: Self-pay

## 2024-12-11 ENCOUNTER — Encounter (HOSPITAL_BASED_OUTPATIENT_CLINIC_OR_DEPARTMENT_OTHER): Payer: Self-pay

## 2024-12-11 ENCOUNTER — Emergency Department (HOSPITAL_BASED_OUTPATIENT_CLINIC_OR_DEPARTMENT_OTHER)

## 2024-12-11 ENCOUNTER — Other Ambulatory Visit: Payer: Self-pay

## 2024-12-11 ENCOUNTER — Emergency Department (HOSPITAL_BASED_OUTPATIENT_CLINIC_OR_DEPARTMENT_OTHER)
Admission: EM | Admit: 2024-12-11 | Discharge: 2024-12-11 | Disposition: A | Discharge status: Home / Self Care | Attending: Emergency Medicine | Admitting: Emergency Medicine

## 2024-12-11 DIAGNOSIS — N3090 Cystitis, unspecified without hematuria: Secondary | ICD-10-CM | POA: Insufficient documentation

## 2024-12-11 DIAGNOSIS — R1011 Right upper quadrant pain: Secondary | ICD-10-CM | POA: Diagnosis present

## 2024-12-11 LAB — COMPREHENSIVE METABOLIC PANEL WITH GFR
ALT: 13 U/L (ref 0–44)
AST: 19 U/L (ref 15–41)
Albumin: 4.5 g/dL (ref 3.5–5.0)
Alkaline Phosphatase: 97 U/L (ref 38–126)
Anion gap: 12 (ref 5–15)
BUN: 9 mg/dL (ref 6–20)
CO2: 26 mmol/L (ref 22–32)
Calcium: 9 mg/dL (ref 8.9–10.3)
Chloride: 105 mmol/L (ref 98–111)
Creatinine, Ser: 0.71 mg/dL (ref 0.44–1.00)
GFR, Estimated: >60 mL/min
Glucose, Bld: 102 mg/dL — ABNORMAL HIGH (ref 70–99)
Potassium: 3.9 mmol/L (ref 3.5–5.1)
Sodium: 142 mmol/L (ref 135–145)
Total Bilirubin: 0.4 mg/dL (ref 0.0–1.2)
Total Protein: 7.1 g/dL (ref 6.5–8.1)

## 2024-12-11 LAB — URINALYSIS, MICROSCOPIC (REFLEX): WBC, UA: >50 WBC/hpf (ref 0–5)

## 2024-12-11 LAB — URINALYSIS, ROUTINE W REFLEX MICROSCOPIC
Bilirubin Urine: NEGATIVE
Glucose, UA: NEGATIVE mg/dL
Ketones, ur: NEGATIVE mg/dL
Nitrite: POSITIVE — AB
Protein, ur: >=300 mg/dL — AB
Specific Gravity, Urine: 1.025 (ref 1.005–1.030)
pH: 6 (ref 5.0–8.0)

## 2024-12-11 LAB — LIPASE, BLOOD: Lipase: 24 U/L (ref 11–51)

## 2024-12-11 LAB — CBC
HCT: 36.6 % (ref 36.0–46.0)
Hemoglobin: 11.4 g/dL — ABNORMAL LOW (ref 12.0–15.0)
MCH: 25.4 pg — ABNORMAL LOW (ref 26.0–34.0)
MCHC: 31.1 g/dL (ref 30.0–36.0)
MCV: 81.7 fL (ref 80.0–100.0)
Platelets: 246 K/uL (ref 150–400)
RBC: 4.48 MIL/uL (ref 3.87–5.11)
RDW: 14.6 % (ref 11.5–15.5)
WBC: 9.4 K/uL (ref 4.0–10.5)
nRBC: 0 % (ref 0.0–0.2)

## 2024-12-11 LAB — PREGNANCY, URINE: Preg Test, Ur: NEGATIVE

## 2024-12-11 MED ORDER — CEPHALEXIN 500 MG PO CAPS: 500.0000 mg | ORAL_CAPSULE | Freq: Four times a day (QID) | ORAL | 0 refills | Status: DC

## 2024-12-11 MED ORDER — SODIUM CHLORIDE 0.9 % IV SOLN
1.0000 g | Freq: Once | INTRAVENOUS | Status: AC
  Administered 2024-12-11: 1 g via INTRAVENOUS
  Filled 2024-12-11: qty 10

## 2024-12-11 MED ORDER — IOHEXOL 300 MG/ML  SOLN
75.0000 mL | Freq: Once | INTRAMUSCULAR | Status: AC | PRN
  Administered 2024-12-11: 75 mL via INTRAVENOUS

## 2024-12-11 MED ORDER — KETOROLAC TROMETHAMINE 15 MG/ML IJ SOLN
15.0000 mg | Freq: Once | INTRAMUSCULAR | Status: AC
  Administered 2024-12-11: 15 mg via INTRAVENOUS
  Filled 2024-12-11: qty 1

## 2024-12-11 NOTE — Discharge Instructions (Addendum)
 Your workup was positive for UTI in the emergency department today.  We started you on antibiotics in the emergency department and I prescribed you Keflex  to your pharmacy.  Please take it 4 times daily for 5 days.  If you experience any worsening symptoms or new concerning symptoms please return to the emergency department for further evaluation.

## 2024-12-11 NOTE — ED Notes (Signed)
Lab called for urine culture ?

## 2024-12-11 NOTE — ED Triage Notes (Signed)
 R flank pain and R sided abd pain for 1 week. Dysuria

## 2024-12-11 NOTE — ED Provider Notes (Signed)
 " [facility] Provider Note   CSN: [id number] Arrival date & time: 12/11/24  1546     Patient presents with: Abdominal Pain and Flank Pain   Carolyn Williams is a 24 y.o. female.  24 year old female presents emergency department with complaints of right flank pain.  She reports she has had increased urine frequency, pain with urination x 4 days and then the past 2 days has had right upper quadrant abdominal pain and right flank pain.  She reports it is getting a little bit worse.  Patient denies nausea, vomiting, fever and she does endorse some associated diarrhea.  Patient was recently hospitalized for sepsis secondary to UTI in November.  Patient is concerned she has another UTI and wanted to be evaluated and make sure she does not have pyelonephritis.  Patient has significant history of depression and anemia.     Prior to Admission medications  Medication Sig Start Date End Date Taking? Authorizing Provider  cephALEXin  (KEFLEX ) 500 MG capsule Take 1 capsule (500 mg total) by mouth 4 (four) times daily. 12/11/24  Yes Myriam Fonda RAMAN, PA-C    Allergies: Patient has no known allergies.    Review of Systems  Gastrointestinal:  Positive for abdominal pain and diarrhea.  Genitourinary:  Positive for dysuria, flank pain and frequency.  All other systems reviewed and are negative.   Updated Vital Signs BP 107/78   Pulse 66   Temp 97.9 F (36.6 C) (Oral)   Resp 18   LMP 12/09/2024   SpO2 99%   Physical Exam Vitals and nursing note reviewed.  Constitutional:      General: She is not in acute distress.    Appearance: Normal appearance. She is well-developed. She is not ill-appearing.  HENT:     Head: Normocephalic and atraumatic.     Nose: Nose normal.  Eyes:     Extraocular Movements: Extraocular movements intact.     Conjunctiva/sclera: Conjunctivae normal.     Pupils: Pupils are equal, round, and reactive to light.  Cardiovascular:      Rate and Rhythm: Normal rate.  Pulmonary:     Effort: Pulmonary effort is normal. No respiratory distress.     Breath sounds: Normal breath sounds.  Abdominal:     General: Abdomen is flat. Bowel sounds are normal. There is no distension.     Palpations: Abdomen is soft.     Tenderness: There is abdominal tenderness in the right upper quadrant. Positive signs include Murphy's sign.  Musculoskeletal:        General: Normal range of motion.     Cervical back: Normal range of motion.  Skin:    General: Skin is warm.     Capillary Refill: Capillary refill takes less than 2 seconds.  Neurological:     General: No focal deficit present.     Mental Status: She is alert.  Psychiatric:        Mood and Affect: Mood normal.        Behavior: Behavior normal.     (all labs ordered are listed, but only abnormal results are displayed) Labs Reviewed  COMPREHENSIVE METABOLIC PANEL WITH GFR - Abnormal; Notable for the following components:      Result Value   Glucose, Bld 102 (*)    All other components within normal limits  CBC - Abnormal; Notable for the following components:   Hemoglobin 11.4 (*)    MCH 25.4 (*)    All other components  within normal limits  URINALYSIS, ROUTINE W REFLEX MICROSCOPIC - Abnormal; Notable for the following components:   APPearance CLOUDY (*)    Hgb urine dipstick LARGE (*)    Protein, ur >=300 (*)    Nitrite POSITIVE (*)    Leukocytes,Ua SMALL (*)    All other components within normal limits  URINALYSIS, MICROSCOPIC (REFLEX) - Abnormal; Notable for the following components:   Bacteria, UA MANY (*)    All other components within normal limits  URINE CULTURE  LIPASE, BLOOD  PREGNANCY, URINE    EKG: None  Radiology: CT ABDOMEN PELVIS W CONTRAST Result Date: 12/11/2024 CLINICAL DATA:  Abdominal pain flank pain EXAM: CT ABDOMEN AND PELVIS WITH CONTRAST TECHNIQUE: Multidetector CT imaging of the abdomen and pelvis was performed using the standard  protocol following bolus administration of intravenous contrast. RADIATION DOSE REDUCTION: This exam was performed according to the departmental dose-optimization program which includes automated exposure control, adjustment of the mA and/or kV according to patient size and/or use of iterative reconstruction technique. CONTRAST:  75mL OMNIPAQUE  IOHEXOL  300 MG/ML  SOLN COMPARISON:  CT 10/17/2024, 09/18/2024, 11/06/2018 FINDINGS: Lower chest: Lung bases are clear. Hepatobiliary: Subcentimeter hypodensity within the inferior liver, too small to further characterize. No calcified gallstone or biliary dilatation Pancreas: Unremarkable. No pancreatic ductal dilatation or surrounding inflammatory changes. Spleen: Borderline enlarged at 13 cm. Adrenals/Urinary Tract: Adrenal glands are normal. Cortical scarring superior pole left kidney. Mild diffuse urothelial enhancement of the right renal pelvis and ureter. No hydronephrosis. No obstructing stone. Mild bladder wall thickening. Stomach/Bowel: Stomach is within normal limits. Appendix appears normal. No evidence of bowel wall thickening, distention, or inflammatory changes. Vascular/Lymphatic: No significant vascular findings are present. No enlarged abdominal or pelvic lymph nodes. Reproductive: Uterus and bilateral adnexa are unremarkable. Other: Small volume free fluid in the pelvis.  No free air. Musculoskeletal: No acute or suspicious osseous abnormality. IMPRESSION: 1. Mild diffuse urothelial enhancement of the right renal pelvis and ureter with mild bladder wall thickening, findings suggestive of cystitis and ascending/upper urinary tract infection. No hydronephrosis or obstructing stone. Focal scarring superior pole left kidney 2. Borderline splenomegaly. 3. Small volume free fluid in the pelvis. Electronically Signed   By: Luke Bun M.D.   On: 12/11/2024 18:28   US  Abdomen Limited RUQ (LIVER/GB) Result Date: 12/11/2024 EXAM: Right Upper Quadrant Abdominal  Ultrasound 12/11/2024 06:09:20 PM TECHNIQUE: Real-time ultrasonography of the right upper quadrant of the abdomen was performed. COMPARISON: None available. CLINICAL HISTORY: RUQ pain. FINDINGS: LIVER: Normal echogenicity. No intrahepatic biliary ductal dilatation. No evidence of mass. Hepatopetal flow in the portal vein. BILIARY SYSTEM: Mild layering sludge seen within the gallbladder. The gallbladder is not distended. There is no gallbladder wall thickening. No pericholecystic fluid is identified. No cholelithiasis. The sonographic Beverley sign is reportedly negative. Common bile duct is within normal limits measuring 3 mm. RIGHT KIDNEY: No hydronephrosis. No echogenic calculi. No mass. PANCREAS: Visualized portions of the pancreas are unremarkable. OTHER: No right upper quadrant ascites. IMPRESSION: 1. Mild gallbladder sludge without sonographic evidence of acute cholecystitis. Electronically signed by: Dorethia Molt MD 12/11/2024 06:17 PM EST RP Workstation: [id number]     Procedures   Medications Ordered in the ED  ketorolac  (TORADOL ) 15 MG/ML injection 15 mg (15 mg Intravenous Given 12/11/24 1823)  iohexol  (OMNIPAQUE ) 300 MG/ML solution 75 mL (75 mLs Intravenous Contrast Given 12/11/24 1808)  cefTRIAXone  (ROCEPHIN ) 1 g in sodium chloride  0.9 % 100 mL IVPB (0 g Intravenous Stopped 12/11/24 1923)  24 y.o. female presents to the ED with complaints of flank pain, right upper quadrant pain, dysuria, The differential diagnosis includes but not limited to UTI, pyelonephritis, kidney stone, cholecystitis, sepsis (Ddx)  On arrival pt is nontoxic, vitals unremarkable. Exam significant for positive Murphy sign, right CVA tenderness, no suprapubic pain  Additional history obtained from chart review significant for recent admission for sepsis secondary to UTI  I ordered medication Rocephin  and Toradol  for pain and UTI  Lab Tests:  CMP lipase CBC UA pregnancy urine ordered in triage UA concerning for  infection with positive nitrites and leukocytes.  No leukocytosis at CBC.  Imaging Studies ordered:  I ordered imaging studies which included right upper quadrant ultrasound and CT abdomen pelvis with contrast, right upper quadrant ultrasound noted to have gallbladder sludge without evidence of cholecystitis.  CT resulted in mild diffuse urethral enhancement of the right renal pelvis and ureter with mild bladder wall thickening consistent with cystitis.  No hydronephrosis or stone noted.  Focal scarring superior pole of the left kidney.  Borderline splenomegaly and small volume of free fluid in the pelvis.  ED Course:   Patient is sitting comfortably in ED bed no acute distress nontoxic-appearing on initial evaluation.  Patient does not fit SIRS criteria.  With positive Murphy sign right upper quadrant ultrasound will be ordered for evaluation of cholecystitis.  With positive urine from triage and history of sepsis secondary to UTI CT will be ordered for evaluation of pyelonephritis versus kidney stone.  No evidence of pyelonephritis or cholecystitis on imaging.  Patient will be started on Rocephin  in ED and given outpatient antibiotics.  Patient was advised on strict return precautions and all findings in the ED, all questions were answered at bedside.  Patient provided strict return precautions and treatment plan.  Patient was comfortable discharge at this time.   Portions of this note were generated with Scientist, clinical (histocompatibility and immunogenetics). Dictation errors may occur despite best attempts at proofreading.   Final diagnoses:  Cystitis    ED Discharge Orders          Ordered    cephALEXin  (KEFLEX ) 500 MG capsule  4 times daily        12/11/24 1929               Myriam Fonda GORMAN DEVONNA 12/12/24 0009    Darra Fonda MATSU, MD 12/17/24 [phone]  "

## 2024-12-11 NOTE — ED Notes (Signed)
 [place] in progress at bedside.

## 2024-12-12 ENCOUNTER — Emergency Department (HOSPITAL_COMMUNITY)
Admission: EM | Admit: 2024-12-12 | Discharge: 2024-12-12 | Disposition: A | Discharge status: Home / Self Care | Attending: Emergency Medicine | Admitting: Emergency Medicine

## 2024-12-12 ENCOUNTER — Encounter (HOSPITAL_COMMUNITY): Payer: Self-pay

## 2024-12-12 ENCOUNTER — Other Ambulatory Visit: Payer: Self-pay

## 2024-12-12 DIAGNOSIS — R000 Tachycardia, unspecified: Secondary | ICD-10-CM | POA: Insufficient documentation

## 2024-12-12 DIAGNOSIS — H538 Other visual disturbances: Secondary | ICD-10-CM | POA: Diagnosis not present

## 2024-12-12 DIAGNOSIS — L509 Urticaria, unspecified: Secondary | ICD-10-CM | POA: Diagnosis present

## 2024-12-12 DIAGNOSIS — T7840XA Allergy, unspecified, initial encounter: Secondary | ICD-10-CM

## 2024-12-12 DIAGNOSIS — T782XXA Anaphylactic shock, unspecified, initial encounter: Secondary | ICD-10-CM | POA: Insufficient documentation

## 2024-12-12 MED ORDER — SULFAMETHOXAZOLE-TRIMETHOPRIM 800-160 MG PO TABS: 1.0000 | ORAL_TABLET | Freq: Two times a day (BID) | ORAL | 0 refills | Status: AC

## 2024-12-12 MED ORDER — SODIUM CHLORIDE 0.9 % IV BOLUS
1000.0000 mL | Freq: Once | INTRAVENOUS | Status: AC
  Administered 2024-12-12: 1000 mL via INTRAVENOUS

## 2024-12-12 MED ORDER — DIPHENHYDRAMINE HCL 50 MG/ML IJ SOLN
25.0000 mg | Freq: Once | INTRAMUSCULAR | Status: AC
  Administered 2024-12-12: 25 mg via INTRAVENOUS
  Filled 2024-12-12: qty 1

## 2024-12-12 MED ORDER — FAMOTIDINE IN NACL 20-0.9 MG/50ML-% IV SOLN
20.0000 mg | Freq: Once | INTRAVENOUS | Status: AC
  Administered 2024-12-12: 20 mg via INTRAVENOUS
  Filled 2024-12-12: qty 50

## 2024-12-12 MED ORDER — PREDNISONE 10 MG PO TABS: 20.0000 mg | ORAL_TABLET | Freq: Every day | ORAL | 0 refills | Status: AC

## 2024-12-12 NOTE — ED Provider Notes (Signed)
 " [place] [facility] Provider Note   CSN: [id number] Arrival date & time: 12/12/24  1909     Patient presents with: Allergic Reaction   Carolyn Williams is a 24 y.o. female.   24 y.o female with a PMH of UTI presents to the ED with a chief complaint of allergic reaction.  Patient reports she began taking her antibiotic for her newly diagnosed UTI, she took 500 mg of cephalexin  around 530, she noticed that she began having some hives, felt short of breath, felt like her throat was closing.  EMS arrived at the scene and gave her some IV Solu-Medrol, she thought that her vision was going blurry, therefore she was given epinephrine.  She does not have any prior history of allergic reaction.  She did.  Some shortness of breath.  She is not having any diarrhea, no nausea, no vomiting, no other complaints reported.  The history is provided by the patient and the EMS personnel.  Allergic Reaction      Prior to Admission medications  Medication Sig Start Date End Date Taking? Authorizing Provider  predniSONE  (DELTASONE ) 10 MG tablet Take 2 tablets (20 mg total) by mouth daily for 3 days. 12/12/24 12/15/24 Yes Gwyneth Fernandez, PA-C  sulfamethoxazole -trimethoprim  (BACTRIM  DS) 800-160 MG tablet Take 1 tablet by mouth 2 (two) times daily for 7 days. 12/12/24 12/19/24 Yes Syra Sirmons, PA-C  cephALEXin  (KEFLEX ) 500 MG capsule Take 1 capsule (500 mg total) by mouth 4 (four) times daily. 12/11/24   Myriam Fonda RAMAN, PA-C    Allergies: Cephalexin     Review of Systems  Constitutional:  Negative for chills and fever.  HENT:  Negative for sore throat.   Respiratory:  Positive for shortness of breath.   Cardiovascular:  Negative for chest pain.  Gastrointestinal:  Negative for abdominal pain, nausea and vomiting.  Genitourinary:  Negative for flank pain.  Musculoskeletal:  Negative for back pain.  Neurological:  Negative for light-headedness and headaches.  All other  systems reviewed and are negative.   Updated Vital Signs BP 110/69   Pulse 81   Temp 98.1 F (36.7 C) (Oral)   Resp 18   LMP 12/09/2024   SpO2 100%   Physical Exam Vitals and nursing note reviewed.  Constitutional:      General: She is not in acute distress.    Appearance: She is well-developed.  HENT:     Head: Normocephalic and atraumatic.     Mouth/Throat:     Pharynx: No oropharyngeal exudate.  Eyes:     Pupils: Pupils are equal, round, and reactive to light.  Cardiovascular:     Rate and Rhythm: Regular rhythm. Tachycardia present.     Heart sounds: Normal heart sounds.  Pulmonary:     Effort: Pulmonary effort is normal. No respiratory distress.     Breath sounds: Normal breath sounds.     Comments: No wheezing.  Abdominal:     General: Bowel sounds are normal. There is no distension.     Palpations: Abdomen is soft.     Tenderness: There is no abdominal tenderness.  Musculoskeletal:        General: No tenderness or deformity.     Cervical back: Normal range of motion.     Right lower leg: No edema.     Left lower leg: No edema.  Skin:    General: Skin is warm and dry.     Comments: Minimal hives noted on lower extremities.  Neurological:     Mental Status: She is alert and oriented to person, place, and time.     (all labs ordered are listed, but only abnormal results are displayed) Labs Reviewed - No data to display  EKG: None  Radiology: CT ABDOMEN PELVIS W CONTRAST Result Date: 12/11/2024 CLINICAL DATA:  Abdominal pain flank pain EXAM: CT ABDOMEN AND PELVIS WITH CONTRAST TECHNIQUE: Multidetector CT imaging of the abdomen and pelvis was performed using the standard protocol following bolus administration of intravenous contrast. RADIATION DOSE REDUCTION: This exam was performed according to the departmental dose-optimization program which includes automated exposure control, adjustment of the mA and/or kV according to patient size and/or use of  iterative reconstruction technique. CONTRAST:  75mL OMNIPAQUE  IOHEXOL  300 MG/ML  SOLN COMPARISON:  CT 10/17/2024, 09/18/2024, 11/06/2018 FINDINGS: Lower chest: Lung bases are clear. Hepatobiliary: Subcentimeter hypodensity within the inferior liver, too small to further characterize. No calcified gallstone or biliary dilatation Pancreas: Unremarkable. No pancreatic ductal dilatation or surrounding inflammatory changes. Spleen: Borderline enlarged at 13 cm. Adrenals/Urinary Tract: Adrenal glands are normal. Cortical scarring superior pole left kidney. Mild diffuse urothelial enhancement of the right renal pelvis and ureter. No hydronephrosis. No obstructing stone. Mild bladder wall thickening. Stomach/Bowel: Stomach is within normal limits. Appendix appears normal. No evidence of bowel wall thickening, distention, or inflammatory changes. Vascular/Lymphatic: No significant vascular findings are present. No enlarged abdominal or pelvic lymph nodes. Reproductive: Uterus and bilateral adnexa are unremarkable. Other: Small volume free fluid in the pelvis.  No free air. Musculoskeletal: No acute or suspicious osseous abnormality. IMPRESSION: 1. Mild diffuse urothelial enhancement of the right renal pelvis and ureter with mild bladder wall thickening, findings suggestive of cystitis and ascending/upper urinary tract infection. No hydronephrosis or obstructing stone. Focal scarring superior pole left kidney 2. Borderline splenomegaly. 3. Small volume free fluid in the pelvis. Electronically Signed   By: Luke Bun M.D.   On: 12/11/2024 18:28   US  Abdomen Limited RUQ (LIVER/GB) Result Date: 12/11/2024 EXAM: Right Upper Quadrant Abdominal Ultrasound 12/11/2024 06:09:20 PM TECHNIQUE: Real-time ultrasonography of the right upper quadrant of the abdomen was performed. COMPARISON: None available. CLINICAL HISTORY: RUQ pain. FINDINGS: LIVER: Normal echogenicity. No intrahepatic biliary ductal dilatation. No evidence of mass.  Hepatopetal flow in the portal vein. BILIARY SYSTEM: Mild layering sludge seen within the gallbladder. The gallbladder is not distended. There is no gallbladder wall thickening. No pericholecystic fluid is identified. No cholelithiasis. The sonographic Beverley sign is reportedly negative. Common bile duct is within normal limits measuring 3 mm. RIGHT KIDNEY: No hydronephrosis. No echogenic calculi. No mass. PANCREAS: Visualized portions of the pancreas are unremarkable. OTHER: No right upper quadrant ascites. IMPRESSION: 1. Mild gallbladder sludge without sonographic evidence of acute cholecystitis. Electronically signed by: Dorethia Molt MD 12/11/2024 06:17 PM EST RP Workstation: [id number]     Procedures   Medications Ordered in the ED  famotidine  (PEPCID ) IVPB 20 mg premix (0 mg Intravenous Stopped 12/12/24 2035)  sodium chloride  0.9 % bolus 1,000 mL (0 mLs Intravenous Stopped 12/12/24 2133)  diphenhydrAMINE  (BENADRYL ) injection 25 mg (25 mg Intravenous Given 12/12/24 2006)                                    Medical Decision Making Risk Prescription drug management.   Patient presented to the ED status post allergic reaction.  Patient reports she was diagnosed with a UTI, given Keflex  took 1 tablet  today at 5:00 when suddenly she began to experience hives, shortness of breath, felt like her throat was closing.  She did have an episode where she felt she was going to faint and had some blurry vision.  EMS arrived they provided her with Solu-Medrol 125, along with epinephrine.  She did get hypotensive for them therefore she was giving 200 cc bolus with improvement in her blood pressure.  When she arrived here she was tachycardic with a heart rate in the 120s, soft blood pressures also noted.  She was complaining of hives around her entire body, her skin does appear somewhat flushed, I do not see any visible hives at this time, her lungs are clear to auscultation.  Her oropharynx is clear without any  signs of airway compromise.  She did not have any diarrhea, no nausea or vomiting.  I did provide patient with some IV Pepcid , she also received a liter bolus to help with blood pressure control.  She does report improvement in her symptoms, she has been monitored in the emergency department for 3 hours, total 4 hours since receiving her first dose of epinephrine.  We discussed changing her antibiotic to Bactrim  to treat her urinary tract infection.  Will also go home on a short course of steroids to help with any inflammation caused by the allergic reaction.  Patient is hemodynamically stable for discharge.  Portions of this note were generated with Scientist, clinical (histocompatibility and immunogenetics). Dictation errors may occur despite best attempts at proofreading.  Final diagnoses:  Anaphylaxis, initial encounter  Allergic reaction, initial encounter    ED Discharge Orders          Ordered    sulfamethoxazole -trimethoprim  (BACTRIM  DS) 800-160 MG tablet  2 times daily        12/12/24 2157    predniSONE  (DELTASONE ) 10 MG tablet  Daily        12/12/24 2209               Osman Calzadilla, PA-C 12/12/24 2217    Ruthe Cornet, DO 12/12/24 2228  "

## 2024-12-12 NOTE — Discharge Instructions (Addendum)
 You were given a prescription for antibiotics in order to help treat your urinary tract infection.  Please take 1 tablet twice a day for the next 7 days.  You were also given a short course of steroids, this should help with all the allergic reaction symptoms that you experienced.  Please take 2 tablets daily for the next 3 days.

## 2024-12-12 NOTE — ED Triage Notes (Signed)
 [place] [facility] [place]. Pt states took cephalexin  500 mg at 1630 and at 1730 she noticed she had hives..Pt states at 1815 she started having blurred vision. EMS gave epi 0.6 mg IV and solumedrol 125 mg IV. Per EMS, iinital bp was 70/30, hr 130. EMS gave NS 200 mL and brought bp up to 80/50.

## 2024-12-13 ENCOUNTER — Ambulatory Visit: Payer: Self-pay

## 2024-12-13 LAB — URINE CULTURE: Culture: >=100000 — AB

## 2024-12-13 NOTE — Telephone Encounter (Signed)
" ° ° °  Copied from CRM [id number]. Topic: Clinical - Medical Advice >> Dec 13, 2024  9:03 AM Revonda D wrote: Reason for CRM: Pt stated that she has a UTI and was prescribed an antibiotic from the hospital. Pt is concerned about taking the medication because the last time she has a UTI and took the medication she went into septic shock. Pt wants to speak with her PCP or nurse before taking the medication to prevent going into septic again.     ----------------------------------------------------------------------- From previous Reason for Contact - Scheduling: Patient/patient representative is calling to schedule an appointment. Refer to attachments for appointment information. Reason for Disposition  Urinating more frequently than usual (i.e., frequency) OR new-onset of the feeling of an urgent need to urinate (i.e., urgency)  Answer Assessment - Initial Assessment Questions Patient requesting appt/ call back with further recommendations.  Advised 911 if symptoms occur/worsen: severe diff breathing, chest pain > 5 min, faint. Patient verbalized understanding.    1. SYMPTOM: What's the main symptom you're concerned about? (e.g., frequency, incontinence)     No symptoms currently, was dianosed with uti 12/11/24, started antibiotic but anaphlaysis afraid to start new med without epipen. History of sepsis from uti. 2. ONSET: When did the    start?     12/11/24 3. PAIN: Is there any pain? If Yes, ask: How bad is it? (Scale: 1-10; mild, moderate, severe)     no 4. CAUSE: What do you think is causing the symptoms?     uti 5. OTHER SYMPTOMS: Do you have any other symptoms? (e.g., blood in urine, fever, flank pain, pain with urination) Denies diff breathing chest pain, faint, fever chills, n/v  Protocols used: Urinary Symptoms-A-AH  "

## 2024-12-13 NOTE — Telephone Encounter (Signed)
 Called [facility], spoke with Emmie, requesting appt, Time Sensitive, patient had anaphylactic shock from antibiotic, has new prescription for antibiotic, afraid to take, has no Epipen.  Reports has history of sepsis from uti.  No available appts

## 2024-12-14 ENCOUNTER — Telehealth (HOSPITAL_BASED_OUTPATIENT_CLINIC_OR_DEPARTMENT_OTHER): Payer: Self-pay | Admitting: *Deleted

## 2024-12-14 NOTE — Telephone Encounter (Signed)
 Patient had an anaphylactic episode with the keflex  and was given bactrim  instead. Patient is afraid to take it due to what happened with the other one and wants to make sure she will be okay taking it.

## 2024-12-14 NOTE — Telephone Encounter (Signed)
 Patient has been notified and patient states she will go ahead and start the medication.

## 2024-12-14 NOTE — Addendum Note (Signed)
 Addended by: TENNIE RAISIN B on: 12/14/2024 10:31 AM   Modules accepted: Orders

## 2024-12-14 NOTE — Telephone Encounter (Signed)
 Post ED Visit - Positive Culture Follow-up  Culture report reviewed by antimicrobial stewardship pharmacist: [facility] [x]  Leonor Bash, [place].D. []  Venetia Gully, Pharm.D., BCPS AQ-ID []  Garrel Crews, Pharm.D., BCPS []  Almarie Lunger, Pharm.D., BCPS []  [place], [address].D., BCPS, AAHIVP []  Rosaline Bihari, Pharm.D., BCPS, AAHIVP []  Vernell Meier, PharmD, BCPS []  Latanya Hint, PharmD, BCPS []  Donald Medley, PharmD, BCPS []  Rocky Bold, PharmD []  Dorothyann Alert, PharmD, BCPS []  Morene Babe, PharmD  [facility] []  Rosaline Edison, PharmD []  Romona Bliss, PharmD []  Dolphus Roller, PharmD []  Veva Seip, Rph []  Vernell Daunt) Leonce, PharmD []  Eva Allis, PharmD []  Rosaline Millet, PharmD []  Iantha Batch, PharmD []  Arvin Gauss, PharmD []  Wanda Hasting, PharmD []  Ronal Rav, PharmD []  Rocky Slade, PharmD []  Bard Jeans, PharmD   Positive urine culture Treated with Cephalexin , organism sensitive to the same and no further patient follow-up is required at this time.  Carolyn Williams 12/14/2024, 1:23 PM

## 2024-12-19 ENCOUNTER — Encounter: Payer: Self-pay | Admitting: General Practice

## 2024-12-19 ENCOUNTER — Telehealth (INDEPENDENT_AMBULATORY_CARE_PROVIDER_SITE_OTHER): Admitting: General Practice

## 2024-12-19 VITALS — Ht 66.0 in | Wt 150.0 lb

## 2024-12-19 DIAGNOSIS — Z87892 Personal history of anaphylaxis: Secondary | ICD-10-CM

## 2024-12-19 DIAGNOSIS — Z09 Encounter for follow-up examination after completed treatment for conditions other than malignant neoplasm: Secondary | ICD-10-CM

## 2024-12-19 DIAGNOSIS — Z87440 Personal history of urinary (tract) infections: Secondary | ICD-10-CM

## 2024-12-19 MED ORDER — EPINEPHRINE 0.3 MG/0.3ML IJ SOAJ: 0.3000 mg | INTRAMUSCULAR | 1 refills | Status: AC | PRN

## 2024-12-19 NOTE — Assessment & Plan Note (Signed)
Reviewed hospital notes, labs.

## 2024-12-19 NOTE — Patient Instructions (Signed)
 Complete bactrim .   As discussed, stay hydrated, continue using good hygiene. Let me know if you develop another UTI. As discussed we may need to consider urology referral.   Also if you change your mind about the allergist, let me know.   Prescription for the epi pen has been sent.   It was a pleasure to see you today!

## 2024-12-19 NOTE — Progress Notes (Signed)
 "  Virtual Visit via Video Note  I connected with Carolyn Williams on 12/19/24 at  3:20 PM EST by a video enabled telemedicine application and verified that I am speaking with the correct person using two identifiers.  Patient Location: Home Provider Location: Home Office  I discussed the limitations, risks, security, and privacy concerns of performing an evaluation and management service by video and the availability of in person appointments. I also discussed with the patient that there may be a patient responsible charge related to this service. The patient expressed understanding and agreed to proceed.  Subjective: PCP: Vincente Shivers, NP  Chief Complaint  Patient presents with   Hospitalization Follow-up    From allergic reaction from abx Cephalexin . Patient is still taking the bactrium and doing well    HPI  Discussed the use of AI scribe software for clinical note transcription with the patient, who gave verbal consent to proceed.  History of Present Illness Carolyn Williams is a 24 year old female who presents with a recent anaphylactic reaction to cefalexin and recurrent UTIs.  She experienced an anaphylactic reaction after taking cefalexin for a UTI on January 13th, characterized by throat closing and hives, necessitating an emergency room visit. EMS administered IV Solu-Medrol and IV Pepcid , which improved her condition, although she experienced blurry vision as a side effect. Her blood pressure was elevated during the reaction but later stabilized.  Following the anaphylactic reaction, she was prescribed Bactrim , a different class of antibiotic, which she is currently taking without any adverse reactions. Her urinary symptoms have improved significantly since starting Bactrim , and she reports no further anaphylactic symptoms or hives.  She has a history of sepsis from a UTI last month, which did not present with typical symptoms. She is sexually active, uses protection, and  practices good hygiene, including urinating after intercourse. She acknowledges not drinking enough water, which she suspects may contribute to her recurrent UTIs. She denies using douches or taking long bubble baths.    ROS: Per HPI Current Medications[1]  Observations/Objective: Today's Vitals   12/19/24 1545  Weight: 150 lb (68 kg)  Height: 5' 6 (1.676 m)   Physical Exam Nursing note reviewed.  Constitutional:      Appearance: Normal appearance.  Eyes:     Conjunctiva/sclera: Conjunctivae normal.  Pulmonary:     Effort: Pulmonary effort is normal.  Neurological:     Mental Status: She is alert and oriented to person, place, and time.  Psychiatric:        Mood and Affect: Mood normal.        Behavior: Behavior normal.        Thought Content: Thought content normal.        Judgment: Judgment normal.     Assessment and Plan: Hx of anaphylaxis -     EPINEPHrine ; Inject 0.3 mg into the muscle as needed for anaphylaxis.  Dispense: 1 each; Refill: 1  History of recurrent UTIs  Hospital discharge follow-up Assessment & Plan: Reviewed hospital notes, labs.     Assessment and Plan Assessment & Plan Anaphylactic reaction to cephalosporin antibiotics Anaphylactic reaction to cefalexin with throat closing and hives. Symptoms improved with IV Solu-Medrol and Pepcid . No further reactions with Bactrim . Discussed potential cross-reactivity with penicillins. - Added cefalexin to allergy list. - Avoid cephalosporin and penicillin antibiotics. - Prescribed EpiPen  and sent to pharmacy. - Educated on keeping EpiPen  and Benadryl  at home. - Discussed potential referral to allergist for further testing, but she opted to wait.  Urinary tract infection Recent UTI treated with Bactrim  with symptom improvement. Discussed prevention strategies to avoid recurrence. - Encouraged increased water intake. - Advised on good hygiene practices. - Instructed to urinate after sexual activity. -  Will consider referral to urologist if recurrent UTIs persist.    Follow Up Instructions: No follow-ups on file.   I discussed the assessment and treatment plan with the patient. The patient was provided an opportunity to ask questions, and all were answered. The patient agreed with the plan and demonstrated an understanding of the instructions.   The patient was advised to call back or seek an in-person evaluation if the symptoms worsen or if the condition fails to improve as anticipated.  The above assessment and management plan was discussed with the patient. The patient verbalized understanding of and has agreed to the management plan.   Carrol Aurora, NP     [1]  Current Outpatient Medications:    EPINEPHrine  0.3 mg/0.3 mL IJ SOAJ injection, Inject 0.3 mg into the muscle as needed for anaphylaxis., Disp: 1 each, Rfl: 1   sulfamethoxazole -trimethoprim  (BACTRIM  DS) 800-160 MG tablet, Take 1 tablet by mouth 2 (two) times daily for 7 days., Disp: 14 tablet, Rfl: 0  "

## 2024-12-24 ENCOUNTER — Inpatient Hospital Stay: Admitting: General Practice

## 2025-01-29 ENCOUNTER — Encounter: Admitting: General Practice
